# Patient Record
Sex: Male | Born: 1955 | Race: Asian | Hispanic: No | Smoking: Current some day smoker
Health system: Southern US, Community
[De-identification: ages and names within clinical notes are randomized; demographics above are authoritative.]

## PROBLEM LIST (undated history)

## (undated) DIAGNOSIS — K759 Inflammatory liver disease, unspecified: Secondary | ICD-10-CM

## (undated) DIAGNOSIS — I714 Abdominal aortic aneurysm, without rupture, unspecified: Secondary | ICD-10-CM

## (undated) DIAGNOSIS — B1910 Unspecified viral hepatitis B without hepatic coma: Secondary | ICD-10-CM

## (undated) DIAGNOSIS — K2960 Other gastritis without bleeding: Secondary | ICD-10-CM

## (undated) DIAGNOSIS — K219 Gastro-esophageal reflux disease without esophagitis: Secondary | ICD-10-CM

## (undated) DIAGNOSIS — D560 Alpha thalassemia: Secondary | ICD-10-CM

## (undated) DIAGNOSIS — I10 Essential (primary) hypertension: Secondary | ICD-10-CM

## (undated) DIAGNOSIS — K2270 Barrett's esophagus without dysplasia: Secondary | ICD-10-CM

## (undated) DIAGNOSIS — K319 Disease of stomach and duodenum, unspecified: Secondary | ICD-10-CM

## (undated) DIAGNOSIS — D563 Thalassemia minor: Secondary | ICD-10-CM

## (undated) HISTORY — PX: BACK SURGERY: SHX140

## (undated) HISTORY — DX: Abdominal aortic aneurysm, without rupture, unspecified: I71.40

## (undated) HISTORY — DX: Abdominal aortic aneurysm, without rupture: I71.4

---

## 2006-01-27 ENCOUNTER — Emergency Department: Payer: Self-pay | Admitting: General Practice

## 2006-02-02 ENCOUNTER — Ambulatory Visit: Payer: Self-pay | Admitting: Gastroenterology

## 2006-03-21 ENCOUNTER — Ambulatory Visit: Payer: Self-pay | Admitting: Gastroenterology

## 2007-09-03 ENCOUNTER — Inpatient Hospital Stay: Payer: Self-pay | Admitting: Gastroenterology

## 2007-09-03 ENCOUNTER — Ambulatory Visit: Payer: Self-pay | Admitting: Gastroenterology

## 2007-09-05 ENCOUNTER — Ambulatory Visit: Payer: Self-pay | Admitting: Internal Medicine

## 2007-10-02 ENCOUNTER — Ambulatory Visit: Payer: Self-pay | Admitting: Internal Medicine

## 2007-10-06 ENCOUNTER — Ambulatory Visit: Payer: Self-pay | Admitting: Internal Medicine

## 2007-11-06 ENCOUNTER — Ambulatory Visit: Payer: Self-pay | Admitting: Internal Medicine

## 2007-12-06 ENCOUNTER — Ambulatory Visit: Payer: Self-pay | Admitting: Internal Medicine

## 2008-01-06 ENCOUNTER — Ambulatory Visit: Payer: Self-pay | Admitting: Internal Medicine

## 2008-02-05 ENCOUNTER — Ambulatory Visit: Payer: Self-pay | Admitting: Internal Medicine

## 2008-03-07 ENCOUNTER — Ambulatory Visit: Payer: Self-pay | Admitting: Internal Medicine

## 2008-04-07 ENCOUNTER — Ambulatory Visit: Payer: Self-pay | Admitting: Internal Medicine

## 2008-06-05 ENCOUNTER — Ambulatory Visit: Payer: Self-pay | Admitting: Internal Medicine

## 2008-07-01 ENCOUNTER — Ambulatory Visit: Payer: Self-pay | Admitting: Internal Medicine

## 2008-07-05 ENCOUNTER — Ambulatory Visit: Payer: Self-pay | Admitting: Internal Medicine

## 2008-09-22 ENCOUNTER — Ambulatory Visit: Payer: Self-pay | Admitting: Gastroenterology

## 2008-09-30 ENCOUNTER — Ambulatory Visit: Payer: Self-pay | Admitting: Internal Medicine

## 2008-10-05 ENCOUNTER — Ambulatory Visit: Payer: Self-pay | Admitting: Internal Medicine

## 2008-11-05 ENCOUNTER — Ambulatory Visit: Payer: Self-pay | Admitting: Internal Medicine

## 2008-11-25 ENCOUNTER — Ambulatory Visit: Payer: Self-pay | Admitting: Internal Medicine

## 2008-12-05 ENCOUNTER — Ambulatory Visit: Payer: Self-pay | Admitting: Internal Medicine

## 2009-02-04 ENCOUNTER — Ambulatory Visit: Payer: Self-pay | Admitting: Internal Medicine

## 2009-02-17 ENCOUNTER — Ambulatory Visit: Payer: Self-pay | Admitting: Internal Medicine

## 2009-03-07 ENCOUNTER — Ambulatory Visit: Payer: Self-pay | Admitting: Internal Medicine

## 2009-08-18 ENCOUNTER — Ambulatory Visit: Payer: Self-pay | Admitting: Internal Medicine

## 2009-09-04 ENCOUNTER — Ambulatory Visit: Payer: Self-pay | Admitting: Internal Medicine

## 2009-11-05 ENCOUNTER — Ambulatory Visit: Payer: Self-pay | Admitting: Internal Medicine

## 2009-11-18 ENCOUNTER — Ambulatory Visit: Payer: Self-pay | Admitting: Internal Medicine

## 2009-12-05 ENCOUNTER — Ambulatory Visit: Payer: Self-pay | Admitting: Internal Medicine

## 2010-06-02 ENCOUNTER — Ambulatory Visit: Payer: Self-pay | Admitting: Internal Medicine

## 2010-06-06 ENCOUNTER — Ambulatory Visit: Payer: Self-pay | Admitting: Internal Medicine

## 2011-04-28 ENCOUNTER — Ambulatory Visit: Payer: Self-pay | Admitting: Oncology

## 2011-04-28 LAB — CBC CANCER CENTER
Basophil #: 0 x10 3/mm (ref 0.0–0.1)
Basophil %: 0.2 %
HCT: 36.2 % — ABNORMAL LOW (ref 40.0–52.0)
HGB: 11.6 g/dL — ABNORMAL LOW (ref 13.0–18.0)
Lymphocyte %: 23.6 %
Monocyte %: 7.1 %
Neutrophil #: 5 x10 3/mm (ref 1.4–6.5)
Neutrophil %: 67.5 %
WBC: 7.4 x10 3/mm (ref 3.8–10.6)

## 2011-04-28 LAB — IRON AND TIBC
Iron Saturation: 32 %
Unbound Iron-Bind.Cap.: 327 ug/dL

## 2011-04-28 LAB — FERRITIN: Ferritin (ARMC): 120 ng/mL (ref 8–388)

## 2011-05-06 ENCOUNTER — Ambulatory Visit: Payer: Self-pay | Admitting: Oncology

## 2011-05-24 LAB — CBC CANCER CENTER
Basophil #: 0 x10 3/mm (ref 0.0–0.1)
Eosinophil #: 0.2 x10 3/mm (ref 0.0–0.7)
Eosinophil %: 2.5 %
HGB: 12 g/dL — ABNORMAL LOW (ref 13.0–18.0)
Lymphocyte #: 1.4 x10 3/mm (ref 1.0–3.6)
Lymphocyte %: 23.1 %
Monocyte %: 7.3 %
Neutrophil #: 4 x10 3/mm (ref 1.4–6.5)
Neutrophil %: 66.5 %
Platelet: 105 x10 3/mm — ABNORMAL LOW (ref 150–440)
RBC: 6.93 10*6/uL — ABNORMAL HIGH (ref 4.40–5.90)
RDW: 17.8 % — ABNORMAL HIGH (ref 11.5–14.5)
WBC: 6.1 x10 3/mm (ref 3.8–10.6)

## 2011-05-26 LAB — CBC CANCER CENTER
Basophil #: 0.1 x10 3/mm (ref 0.0–0.1)
Eosinophil #: 0.1 x10 3/mm (ref 0.0–0.7)
HCT: 37.8 % — ABNORMAL LOW (ref 40.0–52.0)
HGB: 11.8 g/dL — ABNORMAL LOW (ref 13.0–18.0)
Lymphocyte #: 1.6 x10 3/mm (ref 1.0–3.6)
Lymphocyte %: 26.9 %
MCV: 55 fL — ABNORMAL LOW (ref 80–100)
Neutrophil #: 3.7 x10 3/mm (ref 1.4–6.5)
RBC: 6.82 10*6/uL — ABNORMAL HIGH (ref 4.40–5.90)
RDW: 16.9 % — ABNORMAL HIGH (ref 11.5–14.5)
WBC: 5.9 x10 3/mm (ref 3.8–10.6)

## 2011-06-06 ENCOUNTER — Ambulatory Visit: Payer: Self-pay | Admitting: Oncology

## 2011-06-06 ENCOUNTER — Ambulatory Visit: Payer: Self-pay | Admitting: Gastroenterology

## 2011-08-23 ENCOUNTER — Ambulatory Visit: Payer: Self-pay | Admitting: Oncology

## 2011-08-23 LAB — CBC CANCER CENTER
Basophil #: 0 x10 3/mm (ref 0.0–0.1)
Eosinophil #: 0.1 x10 3/mm (ref 0.0–0.7)
Eosinophil %: 2 %
HCT: 36.1 % — ABNORMAL LOW (ref 40.0–52.0)
Lymphocyte %: 20.3 %
MCHC: 30.5 g/dL — ABNORMAL LOW (ref 32.0–36.0)
Monocyte #: 0.6 x10 3/mm (ref 0.2–1.0)
Monocyte %: 9.9 %
Neutrophil #: 4 x10 3/mm (ref 1.4–6.5)
Neutrophil %: 67.1 %
Platelet: 93 x10 3/mm — ABNORMAL LOW (ref 150–440)
RBC: 6.45 10*6/uL — ABNORMAL HIGH (ref 4.40–5.90)
RDW: 17.8 % — ABNORMAL HIGH (ref 11.5–14.5)
WBC: 5.9 x10 3/mm (ref 3.8–10.6)

## 2011-08-23 LAB — IRON AND TIBC
Iron Bind.Cap.(Total): 380 ug/dL (ref 250–450)
Iron: 102 ug/dL (ref 65–175)

## 2011-09-05 ENCOUNTER — Ambulatory Visit: Payer: Self-pay | Admitting: Oncology

## 2012-01-11 ENCOUNTER — Other Ambulatory Visit: Payer: Self-pay | Admitting: Neurosurgery

## 2012-01-11 ENCOUNTER — Other Ambulatory Visit (HOSPITAL_COMMUNITY): Payer: Self-pay | Admitting: Neurosurgery

## 2012-01-11 DIAGNOSIS — M47816 Spondylosis without myelopathy or radiculopathy, lumbar region: Secondary | ICD-10-CM

## 2012-01-17 ENCOUNTER — Ambulatory Visit (HOSPITAL_COMMUNITY)
Admission: RE | Admit: 2012-01-17 | Discharge: 2012-01-17 | Disposition: A | Discharge status: Home / Self Care | Payer: BC Managed Care – PPO | Source: Ambulatory Visit | Attending: Neurosurgery | Admitting: Neurosurgery

## 2012-01-17 DIAGNOSIS — M47816 Spondylosis without myelopathy or radiculopathy, lumbar region: Secondary | ICD-10-CM

## 2012-01-17 DIAGNOSIS — M47817 Spondylosis without myelopathy or radiculopathy, lumbosacral region: Secondary | ICD-10-CM | POA: Insufficient documentation

## 2012-01-17 MED ORDER — OXYCODONE-ACETAMINOPHEN 5-325 MG PO TABS: 1.0000 | ORAL_TABLET | ORAL | Status: DC | PRN

## 2012-01-17 MED ORDER — ONDANSETRON HCL 4 MG/2ML IJ SOLN: 4.0000 mg | Freq: Four times a day (QID) | INTRAMUSCULAR | Status: DC | PRN

## 2012-01-17 MED ORDER — DIAZEPAM 5 MG PO TABS
10.0000 mg | ORAL_TABLET | Freq: Once | ORAL | Status: AC
  Administered 2012-01-17: 10 mg via ORAL

## 2012-01-17 MED ORDER — IOHEXOL 180 MG/ML  SOLN
20.0000 mL | Freq: Once | INTRAMUSCULAR | Status: AC | PRN
  Administered 2012-01-17: 20 mL via INTRATHECAL

## 2012-01-17 MED ORDER — DIAZEPAM 5 MG PO TABS
ORAL_TABLET | ORAL | Status: AC
  Administered 2012-01-17: 10 mg via ORAL
  Filled 2012-01-17: qty 2

## 2012-01-17 NOTE — Procedures (Addendum)
L1/2 puncture, Omnipaque 180

## 2012-01-23 ENCOUNTER — Ambulatory Visit: Payer: Self-pay | Admitting: Gastroenterology

## 2012-01-24 LAB — PATHOLOGY REPORT

## 2012-02-21 ENCOUNTER — Ambulatory Visit: Payer: Self-pay | Admitting: Oncology

## 2012-02-21 LAB — CBC CANCER CENTER
Basophil %: 0.9 %
Eosinophil %: 2.2 %
HGB: 12.2 g/dL — ABNORMAL LOW (ref 13.0–18.0)
Lymphocyte %: 27 %
Monocyte %: 8.8 %
Neutrophil %: 61.1 %
RBC: 7.08 10*6/uL — ABNORMAL HIGH (ref 4.40–5.90)

## 2012-03-07 ENCOUNTER — Ambulatory Visit: Payer: Self-pay | Admitting: Oncology

## 2012-08-17 ENCOUNTER — Ambulatory Visit: Payer: Self-pay | Admitting: Oncology

## 2012-08-21 LAB — CBC CANCER CENTER
Basophil #: 0.1 x10 3/mm (ref 0.0–0.1)
Basophil %: 0.9 %
Eosinophil #: 0.2 x10 3/mm (ref 0.0–0.7)
Eosinophil %: 3.7 %
Lymphocyte %: 24.6 %
MCH: 17.5 pg — ABNORMAL LOW (ref 26.0–34.0)
MCHC: 31.6 g/dL — ABNORMAL LOW (ref 32.0–36.0)
Monocyte #: 0.6 x10 3/mm (ref 0.2–1.0)
Monocyte %: 10.1 %
Neutrophil #: 3.7 x10 3/mm (ref 1.4–6.5)
Neutrophil %: 60.7 %
WBC: 6.1 x10 3/mm (ref 3.8–10.6)

## 2012-09-04 ENCOUNTER — Ambulatory Visit: Payer: Self-pay | Admitting: Oncology

## 2014-03-21 ENCOUNTER — Ambulatory Visit: Payer: Self-pay | Admitting: Gastroenterology

## 2014-03-25 ENCOUNTER — Ambulatory Visit: Payer: Self-pay | Admitting: Gastroenterology

## 2014-03-25 LAB — HEPATIC FUNCTION PANEL A (ARMC)
ALK PHOS: 43 U/L — AB
AST: 27 U/L (ref 15–37)
Albumin: 3.4 g/dL (ref 3.4–5.0)
BILIRUBIN DIRECT: 0.1 mg/dL (ref 0.0–0.2)
Bilirubin,Total: 0.7 mg/dL (ref 0.2–1.0)
SGPT (ALT): 43 U/L
Total Protein: 6.8 g/dL (ref 6.4–8.2)

## 2014-03-25 LAB — CBC WITH DIFFERENTIAL/PLATELET
BASOS PCT: 0.7 %
Basophil #: 0 10*3/uL (ref 0.0–0.1)
Eosinophil #: 0.1 10*3/uL (ref 0.0–0.7)
Eosinophil %: 2.5 %
HCT: 37.5 % — ABNORMAL LOW (ref 40.0–52.0)
HGB: 11.5 g/dL — ABNORMAL LOW (ref 13.0–18.0)
Lymphocyte #: 1.3 10*3/uL (ref 1.0–3.6)
Lymphocyte %: 28.7 %
MCH: 17.5 pg — AB (ref 26.0–34.0)
MCHC: 30.7 g/dL — ABNORMAL LOW (ref 32.0–36.0)
MCV: 57 fL — ABNORMAL LOW (ref 80–100)
MONO ABS: 0.4 x10 3/mm (ref 0.2–1.0)
Monocyte %: 9.7 %
NEUTROS PCT: 58.4 %
Neutrophil #: 2.6 10*3/uL (ref 1.4–6.5)
PLATELETS: 107 10*3/uL — AB (ref 150–440)
RBC: 6.59 10*6/uL — ABNORMAL HIGH (ref 4.40–5.90)
RDW: 16.5 % — AB (ref 11.5–14.5)
WBC: 4.4 10*3/uL (ref 3.8–10.6)

## 2014-03-25 LAB — PROTIME-INR
INR: 1
Prothrombin Time: 13.3 secs (ref 11.5–14.7)

## 2014-06-30 LAB — SURGICAL PATHOLOGY

## 2015-03-23 ENCOUNTER — Other Ambulatory Visit: Payer: Self-pay | Admitting: Gastroenterology

## 2015-03-23 DIAGNOSIS — B181 Chronic viral hepatitis B without delta-agent: Secondary | ICD-10-CM

## 2015-03-26 ENCOUNTER — Ambulatory Visit
Admission: RE | Admit: 2015-03-26 | Discharge: 2015-03-26 | Disposition: A | Discharge status: Home / Self Care | Payer: BLUE CROSS/BLUE SHIELD | Source: Ambulatory Visit | Attending: Gastroenterology | Admitting: Gastroenterology

## 2015-03-26 DIAGNOSIS — B181 Chronic viral hepatitis B without delta-agent: Secondary | ICD-10-CM | POA: Diagnosis not present

## 2015-03-26 DIAGNOSIS — I714 Abdominal aortic aneurysm, without rupture: Secondary | ICD-10-CM | POA: Insufficient documentation

## 2015-07-08 ENCOUNTER — Emergency Department
Admission: EM | Admit: 2015-07-08 | Discharge: 2015-07-08 | Disposition: A | Discharge status: Home / Self Care | Payer: BLUE CROSS/BLUE SHIELD

## 2015-07-10 ENCOUNTER — Emergency Department
Admission: EM | Admit: 2015-07-10 | Discharge: 2015-07-10 | Disposition: A | Discharge status: Home / Self Care | Payer: No Typology Code available for payment source | Attending: Student | Admitting: Student

## 2015-07-10 ENCOUNTER — Emergency Department: Payer: No Typology Code available for payment source

## 2015-07-10 DIAGNOSIS — R52 Pain, unspecified: Secondary | ICD-10-CM

## 2015-07-10 DIAGNOSIS — S7012XA Contusion of left thigh, initial encounter: Secondary | ICD-10-CM | POA: Insufficient documentation

## 2015-07-10 DIAGNOSIS — Y999 Unspecified external cause status: Secondary | ICD-10-CM | POA: Insufficient documentation

## 2015-07-10 DIAGNOSIS — Z87891 Personal history of nicotine dependence: Secondary | ICD-10-CM | POA: Diagnosis not present

## 2015-07-10 DIAGNOSIS — S7002XA Contusion of left hip, initial encounter: Secondary | ICD-10-CM | POA: Insufficient documentation

## 2015-07-10 DIAGNOSIS — I10 Essential (primary) hypertension: Secondary | ICD-10-CM | POA: Diagnosis not present

## 2015-07-10 DIAGNOSIS — Z79899 Other long term (current) drug therapy: Secondary | ICD-10-CM | POA: Diagnosis not present

## 2015-07-10 DIAGNOSIS — Z7982 Long term (current) use of aspirin: Secondary | ICD-10-CM | POA: Diagnosis not present

## 2015-07-10 DIAGNOSIS — S39012A Strain of muscle, fascia and tendon of lower back, initial encounter: Secondary | ICD-10-CM | POA: Diagnosis not present

## 2015-07-10 DIAGNOSIS — S3992XA Unspecified injury of lower back, initial encounter: Secondary | ICD-10-CM | POA: Diagnosis present

## 2015-07-10 DIAGNOSIS — Y939 Activity, unspecified: Secondary | ICD-10-CM | POA: Insufficient documentation

## 2015-07-10 DIAGNOSIS — Y929 Unspecified place or not applicable: Secondary | ICD-10-CM | POA: Insufficient documentation

## 2015-07-10 HISTORY — DX: Essential (primary) hypertension: I10

## 2015-07-10 MED ORDER — METHOCARBAMOL 750 MG PO TABS: 750.0000 mg | ORAL_TABLET | Freq: Four times a day (QID) | ORAL | Status: DC

## 2015-07-10 MED ORDER — TRAMADOL HCL 50 MG PO TABS: 50.0000 mg | ORAL_TABLET | Freq: Four times a day (QID) | ORAL | Status: DC | PRN

## 2015-07-10 MED ORDER — IBUPROFEN 800 MG PO TABS: 800.0000 mg | ORAL_TABLET | Freq: Three times a day (TID) | ORAL | Status: DC | PRN
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
## 2015-11-10 ENCOUNTER — Other Ambulatory Visit: Payer: Self-pay | Admitting: Gastroenterology

## 2015-11-10 DIAGNOSIS — B181 Chronic viral hepatitis B without delta-agent: Secondary | ICD-10-CM

## 2015-11-17 ENCOUNTER — Ambulatory Visit
Admission: RE | Admit: 2015-11-17 | Discharge: 2015-11-17 | Disposition: A | Discharge status: Home / Self Care | Payer: BLUE CROSS/BLUE SHIELD | Source: Ambulatory Visit | Attending: Gastroenterology | Admitting: Gastroenterology

## 2015-11-17 DIAGNOSIS — I77811 Abdominal aortic ectasia: Secondary | ICD-10-CM | POA: Diagnosis not present

## 2015-11-17 DIAGNOSIS — B181 Chronic viral hepatitis B without delta-agent: Secondary | ICD-10-CM | POA: Diagnosis not present

## 2015-11-17 DIAGNOSIS — R161 Splenomegaly, not elsewhere classified: Secondary | ICD-10-CM | POA: Insufficient documentation

## 2015-12-21 ENCOUNTER — Other Ambulatory Visit: Payer: Self-pay | Admitting: Gastroenterology

## 2015-12-21 DIAGNOSIS — B181 Chronic viral hepatitis B without delta-agent: Secondary | ICD-10-CM

## 2015-12-23 ENCOUNTER — Ambulatory Visit
Admission: RE | Admit: 2015-12-23 | Discharge: 2015-12-23 | Disposition: A | Discharge status: Home / Self Care | Payer: BLUE CROSS/BLUE SHIELD | Source: Ambulatory Visit | Attending: Gastroenterology | Admitting: Gastroenterology

## 2015-12-23 ENCOUNTER — Other Ambulatory Visit: Payer: Self-pay | Admitting: Gastroenterology

## 2015-12-23 DIAGNOSIS — B181 Chronic viral hepatitis B without delta-agent: Secondary | ICD-10-CM | POA: Diagnosis present

## 2016-05-10 ENCOUNTER — Other Ambulatory Visit: Payer: Self-pay | Admitting: Gastroenterology

## 2016-05-10 DIAGNOSIS — B181 Chronic viral hepatitis B without delta-agent: Secondary | ICD-10-CM

## 2016-05-13 ENCOUNTER — Ambulatory Visit
Admission: RE | Admit: 2016-05-13 | Discharge: 2016-05-13 | Disposition: A | Discharge status: Home / Self Care | Payer: BLUE CROSS/BLUE SHIELD | Source: Ambulatory Visit | Attending: Gastroenterology | Admitting: Gastroenterology

## 2016-05-13 DIAGNOSIS — I77811 Abdominal aortic ectasia: Secondary | ICD-10-CM | POA: Diagnosis not present

## 2016-05-13 DIAGNOSIS — B181 Chronic viral hepatitis B without delta-agent: Secondary | ICD-10-CM | POA: Insufficient documentation

## 2016-11-15 ENCOUNTER — Other Ambulatory Visit: Payer: Self-pay | Admitting: Gastroenterology

## 2016-11-15 DIAGNOSIS — B181 Chronic viral hepatitis B without delta-agent: Secondary | ICD-10-CM

## 2016-11-16 DIAGNOSIS — B181 Chronic viral hepatitis B without delta-agent: Secondary | ICD-10-CM | POA: Insufficient documentation

## 2016-11-21 ENCOUNTER — Ambulatory Visit
Admission: RE | Admit: 2016-11-21 | Discharge: 2016-11-21 | Disposition: A | Discharge status: Home / Self Care | Payer: Medicare Other | Source: Ambulatory Visit | Attending: Gastroenterology | Admitting: Gastroenterology

## 2016-11-21 DIAGNOSIS — K7460 Unspecified cirrhosis of liver: Secondary | ICD-10-CM | POA: Insufficient documentation

## 2016-11-21 DIAGNOSIS — I77811 Abdominal aortic ectasia: Secondary | ICD-10-CM | POA: Diagnosis not present

## 2016-11-21 DIAGNOSIS — B181 Chronic viral hepatitis B without delta-agent: Secondary | ICD-10-CM | POA: Diagnosis present

## 2017-01-30 ENCOUNTER — Encounter: Payer: Self-pay | Admitting: *Deleted

## 2017-01-31 ENCOUNTER — Encounter: Payer: Self-pay | Admitting: *Deleted

## 2017-01-31 ENCOUNTER — Encounter
Admission: RE | Disposition: A | Discharge status: Home / Self Care | Payer: Self-pay | Source: Ambulatory Visit | Attending: Internal Medicine

## 2017-01-31 ENCOUNTER — Ambulatory Visit: Payer: Medicare Other | Admitting: Certified Registered Nurse Anesthetist

## 2017-01-31 ENCOUNTER — Ambulatory Visit
Admission: RE | Admit: 2017-01-31 | Discharge: 2017-01-31 | Disposition: A | Discharge status: Home / Self Care | Payer: Medicare Other | Source: Ambulatory Visit | Attending: Internal Medicine | Admitting: Internal Medicine

## 2017-01-31 DIAGNOSIS — I10 Essential (primary) hypertension: Secondary | ICD-10-CM | POA: Diagnosis not present

## 2017-01-31 DIAGNOSIS — D560 Alpha thalassemia: Secondary | ICD-10-CM | POA: Diagnosis not present

## 2017-01-31 DIAGNOSIS — J449 Chronic obstructive pulmonary disease, unspecified: Secondary | ICD-10-CM | POA: Diagnosis not present

## 2017-01-31 DIAGNOSIS — K2270 Barrett's esophagus without dysplasia: Secondary | ICD-10-CM | POA: Diagnosis not present

## 2017-01-31 DIAGNOSIS — K219 Gastro-esophageal reflux disease without esophagitis: Secondary | ICD-10-CM | POA: Insufficient documentation

## 2017-01-31 DIAGNOSIS — K3189 Other diseases of stomach and duodenum: Secondary | ICD-10-CM | POA: Diagnosis not present

## 2017-01-31 DIAGNOSIS — Z79899 Other long term (current) drug therapy: Secondary | ICD-10-CM | POA: Insufficient documentation

## 2017-01-31 DIAGNOSIS — K5730 Diverticulosis of large intestine without perforation or abscess without bleeding: Secondary | ICD-10-CM | POA: Diagnosis not present

## 2017-01-31 DIAGNOSIS — F17200 Nicotine dependence, unspecified, uncomplicated: Secondary | ICD-10-CM | POA: Insufficient documentation

## 2017-01-31 DIAGNOSIS — Z1211 Encounter for screening for malignant neoplasm of colon: Secondary | ICD-10-CM | POA: Insufficient documentation

## 2017-01-31 DIAGNOSIS — Z7982 Long term (current) use of aspirin: Secondary | ICD-10-CM | POA: Diagnosis not present

## 2017-01-31 DIAGNOSIS — K635 Polyp of colon: Secondary | ICD-10-CM | POA: Insufficient documentation

## 2017-01-31 HISTORY — DX: Alpha thalassemia: D56.0

## 2017-01-31 HISTORY — DX: Gastro-esophageal reflux disease without esophagitis: K21.9

## 2017-01-31 HISTORY — DX: Inflammatory liver disease, unspecified: K75.9

## 2017-01-31 HISTORY — DX: Disease of stomach and duodenum, unspecified: K31.9

## 2017-01-31 HISTORY — DX: Barrett's esophagus without dysplasia: K22.70

## 2017-01-31 HISTORY — PX: COLONOSCOPY WITH PROPOFOL: SHX5780

## 2017-01-31 HISTORY — DX: Thalassemia minor: D56.3

## 2017-01-31 HISTORY — DX: Other gastritis without bleeding: K29.60

## 2017-01-31 HISTORY — PX: ESOPHAGOGASTRODUODENOSCOPY (EGD) WITH PROPOFOL: SHX5813

## 2017-01-31 LAB — CBC WITH DIFFERENTIAL/PLATELET
Basophils Absolute: 0 10*3/uL (ref 0–0.1)
Basophils Relative: 1 %
Eosinophils Absolute: 0.5 10*3/uL (ref 0–0.7)
Eosinophils Relative: 7 %
HEMATOCRIT: 43 % (ref 40.0–52.0)
Hemoglobin: 13.3 g/dL (ref 13.0–18.0)
LYMPHS ABS: 1.8 10*3/uL (ref 1.0–3.6)
LYMPHS PCT: 26 %
MCH: 17 pg — AB (ref 26.0–34.0)
MCHC: 30.9 g/dL — AB (ref 32.0–36.0)
MCV: 55.1 fL — AB (ref 80.0–100.0)
MONO ABS: 0.4 10*3/uL (ref 0.2–1.0)
MONOS PCT: 6 %
NEUTROS ABS: 4.3 10*3/uL (ref 1.4–6.5)
Neutrophils Relative %: 60 %
Platelets: 141 10*3/uL — ABNORMAL LOW (ref 150–440)
RBC: 7.8 MIL/uL — ABNORMAL HIGH (ref 4.40–5.90)
RDW: 16.2 % — AB (ref 11.5–14.5)
WBC: 7.1 10*3/uL (ref 3.8–10.6)

## 2017-01-31 LAB — PROTIME-INR
INR: 0.96
Prothrombin Time: 12.7 seconds (ref 11.4–15.2)

## 2017-01-31 SURGERY — ESOPHAGOGASTRODUODENOSCOPY (EGD) WITH PROPOFOL
Anesthesia: General

## 2017-01-31 MED ORDER — PROPOFOL 500 MG/50ML IV EMUL
INTRAVENOUS | Status: AC
  Filled 2017-01-31: qty 50

## 2017-01-31 MED ORDER — PROPOFOL 10 MG/ML IV BOLUS
INTRAVENOUS | Status: DC | PRN
  Administered 2017-01-31: 240 mg via INTRAVENOUS

## 2017-01-31 MED ORDER — SODIUM CHLORIDE 0.9 % IV SOLN
INTRAVENOUS | Status: DC
  Administered 2017-01-31: 13:00:00 via INTRAVENOUS

## 2017-01-31 MED ORDER — LIDOCAINE HCL (CARDIAC) 20 MG/ML IV SOLN
INTRAVENOUS | Status: DC | PRN
  Administered 2017-01-31: 50 mg via INTRAVENOUS

## 2017-01-31 NOTE — Anesthesia Preprocedure Evaluation (Signed)
Anesthesia Evaluation  Patient identified by MRN, date of birth, ID band Patient awake    Reviewed: Allergy & Precautions, NPO status , Patient's Chart, lab work & pertinent test results  Airway Mallampati: III       Dental  (+) Teeth Intact   Pulmonary COPD, Current Smoker,     + decreased breath sounds      Cardiovascular Exercise Tolerance: Good hypertension, Pt. on medications  Rhythm:Regular Rate:Normal     Neuro/Psych negative neurological ROS  negative psych ROS   GI/Hepatic PUD, GERD  Medicated,(+) Hepatitis -, Unspecified  Endo/Other  negative endocrine ROS  Renal/GU negative Renal ROS  negative genitourinary   Musculoskeletal   Abdominal Normal abdominal exam  (+)   Peds negative pediatric ROS (+)  Hematology  (+) anemia ,   Anesthesia Other Findings   Reproductive/Obstetrics                             Anesthesia Physical Anesthesia Plan  ASA: II  Anesthesia Plan: General   Post-op Pain Management:    Induction:   PONV Risk Score and Plan: 0  Airway Management Planned: Natural Airway and Nasal Cannula  Additional Equipment:   Intra-op Plan:   Post-operative Plan:   Informed Consent: I have reviewed the patients History and Physical, chart, labs and discussed the procedure including the risks, benefits and alternatives for the proposed anesthesia with the patient or authorized representative who has indicated his/her understanding and acceptance.     Plan Discussed with: CRNA  Anesthesia Plan Comments:         Anesthesia Quick Evaluation

## 2017-01-31 NOTE — H&P (Signed)
Outpatient short stay form Pre-procedure 01/31/2017 1:54 PM Bee Marchiano K. Norma Fredricksonoledo, M.D.  Primary Physician: Dewaine Oatsenny Tate, M.D.  Reason for visit: Barrett's esophagus surveillance, Colon cancer screening.  History of present illness patient is a pleasant 61 year old Asian male presenting for Barrett's esophagus surveillance without history of dysplasia as well as for general colon cancer screening. Patient denies a change in bowel habits, dysphagia rectal bleeding or abdominal pain. oW abdominal pain.  without history of dysplasia as well as for general colon cancer screening.  Patient denies any change in bowel habits, dysphagia rectal bleeding patient is a pleasant 61 year old Asian male presenting for Barrett's esophagus surveillance    Current Facility-Administered Medications:  .  0.9 %  sodium chloride infusion, , Intravenous, Continuous, [place]oledo, Boykin Nearingeodoro K, MD, Last Rate: 20 mL/hr at 01/31/17 1304  Medications Prior to Admission  Medication Sig Dispense Refill Last Dose  . aspirin EC 81 MG tablet Take 81 mg by mouth daily.   Past Month at Unknown time  . carvedilol (COREG) 25 MG tablet Take 25 mg by mouth 2 (two) times daily with a meal.   01/30/2017 at Unknown time  . folic acid (FOLVITE) 1 MG tablet Take 1 mg by mouth daily.   01/30/2017 at Unknown time  . hydrochlorothiazide (HYDRODIURIL) 25 MG tablet Take 25 mg by mouth daily.   01/30/2017 at Unknown time  . ibuprofen (ADVIL,MOTRIN) 800 MG tablet Take 1 tablet (800 mg total) by mouth every 8 (eight) hours as needed. 30 tablet 0 Past Month at Unknown time  . lisinopril (PRINIVIL,ZESTRIL) 30 MG tablet Take 30 mg by mouth 2 (two) times daily.    01/30/2017 at Unknown time  . omeprazole (PRILOSEC) 20 MG capsule Take 20 mg by mouth daily.   01/30/2017 at Unknown time  . polyethylene glycol-electrolytes (NULYTELY/GOLYTELY) 420 g solution Take 4,000 mLs by mouth once.     Marland Kitchen. tenofovir (VIREAD) 300 MG tablet Take 300 mg by mouth daily.     .  methocarbamol (ROBAXIN-750) 750 MG tablet Take 1 tablet (750 mg total) by mouth 4 (four) times daily. (Patient not taking: Reported on 01/31/2017) 20 tablet 0 Not Taking at Unknown time  . traMADol (ULTRAM) 50 MG tablet Take 1 tablet (50 mg total) by mouth every 6 (six) hours as needed for moderate pain. (Patient not taking: Reported on 01/31/2017) 12 tablet 0 Not Taking at Unknown time     No Known Allergies   Past Medical History:  Diagnosis Date  . Alpha+ thalassemia   . Barrett's esophagus   . Erosive gastritis   . Gastropathy   . GERD (gastroesophageal reflux disease)   . Hepatitis    chronic hep.B of low infectivity  . Hypertension     Review of systems:      Physical Exam  General appearance: alert, cooperative and appears stated age Resp: clear to auscultation bilaterally Cardio: regular rate and rhythm, S1, S2 normal, no murmur, click, rub or gallop GI: soft, non-tender; bowel sounds normal; no masses,  no organomegaly Extremities: extremities normal, atraumatic, no cyanosis or edema EGD and colonoscopy..     Planned procedures: EGD and colonoscopy. The patient understands the nature of the planned procedure, indications, risks, alternatives and potential complications including but not limited to bleeding, infection, perforation, damage to internal organs and possible oversedation/side effects from anesthesia. The patient agrees and gives consent to proceed.  Please refer to procedure notes for findings, recommendations and patient disposition/instructions.    Steven Veazie K. Norma Fredricksonoledo, M.D. Gastroenterology 01/31/2017  1:54 PM

## 2017-01-31 NOTE — Anesthesia Post-op Follow-up Note (Signed)
Anesthesia QCDR form completed.        

## 2017-01-31 NOTE — Op Note (Signed)
[facility] Gastroenterology Patient Name: Lawson FiscalBandy Syme Procedure Date: 01/31/2017 1:37 PM MRN: [id number] Account #: [id number] Date of Birth: 05/25/1955 Admit Type: Outpatient Age: [age] Room: [facility] ENDO ROOM 4 Gender: Male Note Status: Finalized Procedure:            Colonoscopy Indications:          Screening for colorectal malignant neoplasm Providers:            Boykin Nearingeodoro K. Norma Fredricksonoledo MD, MD Referring MD:         Jillene Bucksenny C. Arlana Pouchate, MD (Referring MD) Medicines:            Propofol per Anesthesia Complications:        No immediate complications. Procedure:            Pre-Anesthesia Assessment:                       - The risks and benefits of the procedure and the                        sedation options and risks were discussed with the                        patient. All questions were answered and informed                        consent was obtained.                       - Patient identification and proposed procedure were                        verified prior to the procedure by the nurse. The                        procedure was verified in the procedure room.                       - ASA Grade Assessment: II - A patient with mild                        systemic disease.                       - After reviewing the risks and benefits, the patient                        was deemed in satisfactory condition to undergo the                        procedure.                       After obtaining informed consent, the colonoscope was                        passed under direct vision. Throughout the procedure,                        the patient's blood pressure, pulse, and oxygen  saturations were monitored continuously. The                        Colonoscope was introduced through the anus and                        advanced to the the cecum, identified by appendiceal                        orifice and ileocecal valve. The colonoscopy was           performed without difficulty. The patient tolerated the                        procedure well. The quality of the bowel preparation                        was good. The ileocecal valve, appendiceal orifice, and                        rectum were photographed. Findings:      The perianal and digital rectal examinations were normal. Pertinent       negatives include normal sphincter tone and no palpable rectal lesions.      Multiple small and large-mouthed diverticula were found in the entire       colon. There was no evidence of diverticular bleeding.      A 3 mm polyp was found in the sigmoid colon. The polyp was sessile. The       polyp was removed with a jumbo cold forceps. Resection and retrieval       were complete.      The exam was otherwise without abnormality on direct and retroflexion       views. Impression:           - Moderate diverticulosis in the entire examined colon.                        There was no evidence of diverticular bleeding.                       - One 3 mm polyp in the sigmoid colon, removed with a                        jumbo cold forceps. Resected and retrieved.                       - The examination was otherwise normal on direct and                        retroflexion views. Recommendation:       - Patient has a contact number available for                        emergencies. The signs and symptoms of potential                        delayed complications were discussed with the patient.                        Return to normal activities tomorrow. Written  discharge                        instructions were provided to the patient.                       - Resume previous diet.                       - Continue present medications.                       - Await pathology results.                       - Repeat colonoscopy is recommended for surveillance.                        The colonoscopy date will be determined after pathology                         results from today's exam become available for review.                       - Return to GI office in 1 year.                       - The findings and recommendations were discussed with                        the patient and their family. Procedure Code(s):    --- Professional ---                       [id number], Colonoscopy, flexible; with biopsy, single or                        multiple Diagnosis Code(s):    --- Professional ---                       Z12.11, Encounter for screening for malignant neoplasm                        of colon                       D12.5, Benign neoplasm of sigmoid colon                       K57.30, Diverticulosis of large intestine without                        perforation or abscess without bleeding CPT copyright 2016 American Medical Association. All rights reserved. The codes documented in this report are preliminary and upon coder review may  be revised to meet current compliance requirements. Stanton Kidneyeodoro K Ryland Smoots MD, MD 01/31/2017 2:25:00 PM This report has been signed electronically. Number of Addenda: 0 Note Initiated On: 01/31/2017 1:37 PM Scope Withdrawal Time: 0 hours 6 minutes 39 seconds  Total Procedure Duration: 0 hours 9 minutes 33 seconds       [facility]

## 2017-01-31 NOTE — Interval H&P Note (Signed)
History and Physical Interval Note:  01/31/2017 1:57 PM  Arthur Schroeder  has presented today for surgery, with the diagnosis of SCREENING BARRETTS  The various methods of treatment have been discussed with the patient and family. After consideration of risks, benefits and other options for treatment, the patient has consented to  Procedure(s): ESOPHAGOGASTRODUODENOSCOPY (EGD) WITH PROPOFOL (N/A) COLONOSCOPY WITH PROPOFOL (N/A) as a surgical intervention .  The patient's history has been reviewed, patient examined, no change in status, stable for surgery.  I have reviewed the patient's chart and labs.  Questions were answered to the patient's satisfaction.     [name], [place]

## 2017-01-31 NOTE — Transfer of Care (Signed)
Immediate Anesthesia Transfer of Care Note  Patient: Arthur Schroeder  Procedure(s) Performed: ESOPHAGOGASTRODUODENOSCOPY (EGD) WITH PROPOFOL (N/A ) COLONOSCOPY WITH PROPOFOL (N/A )  Patient Location: PACU  Anesthesia Type:MAC  Level of Consciousness: drowsy  Airway & Oxygen Therapy: Patient Spontanous Breathing and Patient connected to nasal cannula oxygen  Post-op Assessment: Report given to RN and Post -op Vital signs reviewed and stable  Post vital signs: Reviewed and stable  Last Vitals:  Vitals:   01/31/17 1243 01/31/17 1424  BP: 132/78 (P) 96/70  Pulse: 70   Resp: 16   Temp: 36.7 C (P) 36.7 C  SpO2: 98%     Last Pain:  Vitals:   01/31/17 1424  TempSrc: (P) Tympanic         Complications: No apparent anesthesia complications

## 2017-01-31 NOTE — Op Note (Signed)
[facility] Gastroenterology Patient Name: Arthur Schroeder Procedure Date: 01/31/2017 1:37 PM MRN: [id number] Account #: [id number] Date of Birth: 1955-10-14 Admit Type: Outpatient Age: 61 Room: [facility] ENDO ROOM 4 Gender: Male Note Status: Finalized Procedure:            Upper GI endoscopy Indications:          Surveillance for malignancy due to personal history of                        Barrett's esophagus Providers:            Boykin Nearing. Norma Fredrickson MD, MD Referring MD:         Jillene Bucks. Arlana Pouch, MD (Referring MD) Medicines:            Propofol per Anesthesia Complications:        No immediate complications. Procedure:            Pre-Anesthesia Assessment:                       - The risks and benefits of the procedure and the                        sedation options and risks were discussed with the                        patient. All questions were answered and informed                        consent was obtained.                       - Patient identification and proposed procedure were                        verified prior to the procedure by the physician and                        the nurse. The procedure was verified in the procedure                        room.                       - ASA Grade Assessment: II - A patient with mild                        systemic disease.                       - After reviewing the risks and benefits, the patient                        was deemed in satisfactory condition to undergo the                        procedure.                       After obtaining informed consent, the endoscope was  passed under direct vision. Throughout the procedure,                        the patient's blood pressure, pulse, and oxygen                        saturations were monitored continuously. The Endoscope                        was introduced through the mouth, and advanced to the                        third part of duodenum.  The upper GI endoscopy was                        accomplished without difficulty. The patient tolerated                        the procedure well. Findings:      The esophagus and gastroesophageal junction were examined with white       light. There were esophageal mucosal changes consistent with Barrett's       esophagus. These changes involved the mucosa along an irregular Z-line       (37 cm from the incisors). Salmon-colored mucosa was present. The       maximum longitudinal extent of these esophageal mucosal changes was 0.5       cm in length. Mucosa was biopsied with a cold forceps for histology in 4       quadrants at intervals of 0.5 cm at the gastroesophageal junction. One       specimen bottle was sent to pathology.      The entire examined stomach was normal. Biopsies were taken with a cold       forceps for histology. Biopsied done in cardia      The examined duodenum was normal. Impression:           - Esophageal mucosal changes consistent with Barrett's                        esophagus. Biopsied.                       - Normal stomach. Biopsied.                       - Normal examined duodenum. Recommendation:       - Await pathology results.                       - See the other procedure note for documentation of                        additional recommendations. Procedure Code(s):    --- Professional ---                       [phone], Esophagogastroduodenoscopy, flexible, transoral;                        with biopsy, single or multiple Diagnosis Code(s):    --- Professional ---  K22.70, Barrett's esophagus without dysplasia CPT copyright 2016 American Medical Association. All rights reserved. The codes documented in this report are preliminary and upon coder review may  be revised to meet current compliance requirements. Stanton Kidneyeodoro K Mauriana Dann MD, MD 01/31/2017 2:09:25 PM This report has been signed electronically. Number of Addenda: 0 Note Initiated  On: 01/31/2017 1:37 PM      [facility]

## 2017-02-01 ENCOUNTER — Encounter: Payer: Self-pay | Admitting: Internal Medicine

## 2017-02-01 NOTE — Anesthesia Postprocedure Evaluation (Signed)
Anesthesia Post Note  Patient: Lawson FiscalBandy Tomasetti  Procedure(s) Performed: ESOPHAGOGASTRODUODENOSCOPY (EGD) WITH PROPOFOL (N/A ) COLONOSCOPY WITH PROPOFOL (N/A )  Patient location during evaluation: PACU Anesthesia Type: General Level of consciousness: awake Pain management: pain level controlled Vital Signs Assessment: post-procedure vital signs reviewed and stable Respiratory status: nonlabored ventilation Cardiovascular status: stable Anesthetic complications: no     Last Vitals:  Vitals:   01/31/17 1444 01/31/17 1454  BP: 125/87 125/87  Pulse: 67 65  Resp: (!) 24 (!) 22  Temp:    SpO2: 97% 97%    Last Pain:  Vitals:   01/31/17 1424  TempSrc: Tympanic                 VAN STAVEREN,Rosa [place]ale

## 2017-02-02 LAB — SURGICAL PATHOLOGY

## 2017-10-12 ENCOUNTER — Other Ambulatory Visit: Payer: Self-pay | Admitting: Gastroenterology

## 2017-10-12 DIAGNOSIS — B181 Chronic viral hepatitis B without delta-agent: Secondary | ICD-10-CM

## 2017-10-17 ENCOUNTER — Ambulatory Visit
Admission: RE | Admit: 2017-10-17 | Discharge: 2017-10-17 | Disposition: A | Discharge status: Home / Self Care | Payer: Medicare Other | Source: Ambulatory Visit | Attending: Gastroenterology | Admitting: Gastroenterology

## 2017-10-17 DIAGNOSIS — R932 Abnormal findings on diagnostic imaging of liver and biliary tract: Secondary | ICD-10-CM | POA: Diagnosis not present

## 2017-10-17 DIAGNOSIS — I714 Abdominal aortic aneurysm, without rupture: Secondary | ICD-10-CM | POA: Diagnosis not present

## 2017-10-17 DIAGNOSIS — B181 Chronic viral hepatitis B without delta-agent: Secondary | ICD-10-CM | POA: Insufficient documentation

## 2017-10-31 ENCOUNTER — Encounter (INDEPENDENT_AMBULATORY_CARE_PROVIDER_SITE_OTHER): Payer: Self-pay | Admitting: Vascular Surgery

## 2017-10-31 ENCOUNTER — Ambulatory Visit (INDEPENDENT_AMBULATORY_CARE_PROVIDER_SITE_OTHER): Payer: Medicare Other | Admitting: Vascular Surgery

## 2017-10-31 VITALS — BP 131/80 | HR 62 | Resp 12 | Ht 68.0 in | Wt 176.0 lb

## 2017-10-31 DIAGNOSIS — I714 Abdominal aortic aneurysm, without rupture, unspecified: Secondary | ICD-10-CM

## 2017-10-31 DIAGNOSIS — K2100 Gastro-esophageal reflux disease with esophagitis, without bleeding: Secondary | ICD-10-CM

## 2017-10-31 DIAGNOSIS — K219 Gastro-esophageal reflux disease without esophagitis: Secondary | ICD-10-CM | POA: Insufficient documentation

## 2017-10-31 DIAGNOSIS — I10 Essential (primary) hypertension: Secondary | ICD-10-CM

## 2017-10-31 NOTE — Assessment & Plan Note (Signed)
blood pressure control important in reducing the progression of atherosclerotic disease and aneurysmal disease. On appropriate oral medications.  His severe, poorly controlled hypertension previously may have been a major cause of his aneurysmal degeneration.

## 2017-10-31 NOTE — Assessment & Plan Note (Signed)
Continue PPI as already ordered, this medication has been reviewed and there are no changes at this time. Avoidence of caffeine and alcohol Moderate elevation of the head of the bed  

## 2017-10-31 NOTE — Patient Instructions (Signed)
Abdominal Aortic Aneurysm Blood pumps away from the heart through tubes (blood vessels) called arteries. Aneurysms are weak or damaged places in the wall of an artery. It bulges out like a balloon. An abdominal aortic aneurysm happens in the main artery of the body (aorta). It can burst or tear, causing bleeding inside the body. This is an emergency. It needs treatment right away. What are the causes? The exact cause is unknown. Things that could cause this problem include:  Fat and other substances building up in the lining of a tube.  Swelling of the walls of a blood vessel.  Certain tissue diseases.  Belly (abdominal) trauma.  An infection in the main artery of the body.  What increases the risk? There are things that make it more likely for you to have an aneurysm. These include:  Being over the age of 64 years old.  Having high blood pressure (hypertension).  Being a male.  Being white.  Being very overweight (obese).  Having a family history of aneurysm.  Using tobacco products.  What are the signs or symptoms? Symptoms depend on the size of the aneurysm and how fast it grows. There may not be symptoms. If symptoms occur, they can include:  Pain (belly, side, lower back, or groin).  Feeling full after eating a small amount of food.  Feeling sick to your stomach (nauseous), throwing up (vomiting), or both.  Feeling a lump in your belly that feels like it is beating (pulsating).  Feeling like you will pass out (faint).  How is this treated?  Medicine to control blood pressure and pain.  Imaging tests to see if the aneurysm gets bigger.  Surgery. How is this prevented? To lessen your chance of getting this condition:  Stop smoking. Stop chewing tobacco.  Limit or avoid alcohol.  Keep your blood pressure, blood sugar, and cholesterol within normal limits.  Eat less salt.  Eat foods low in saturated fats and cholesterol. These are found in animal and  whole dairy products.  Eat more fiber. Fiber is found in whole grains, vegetables, and fruits.  Keep a healthy weight.  Stay active and exercise often.  This information is not intended to replace advice given to you by your health care provider. Make sure you discuss any questions you have with your health care provider. Document Released: 06/18/2012 Document Revised: 07/30/2015 Document Reviewed: 03/23/2012 Elsevier Interactive Patient Education  2017 [employer] Inc[employer]  

## 2017-10-31 NOTE — Assessment & Plan Note (Signed)
As part of his work-up he underwent a duplex ultrasound which I have independently reviewed.  This shows an approximately 3.2 cm abdominal aortic aneurysm in the infrarenal aorta.  No surgery or intervention at this time. The patient has an asymptomatic abdominal aortic aneurysm that is less than 4 cm in maximal diameter.  I have discussed the natural history of abdominal aortic aneurysm and the small risk of rupture for aneurysm less than 5 cm in size.  However, as these small aneurysms tend to enlarge over time, continued surveillance with ultrasound or CT scan is mandatory.  I have also discussed optimizing medical management with hypertension and lipid control and the importance of abstinence from tobacco.  The patient is also encouraged to exercise a minimum of 30 minutes 4 times a week.  Should the patient develop new onset abdominal or back pain or signs of peripheral embolization they are instructed to seek medical attention immediately and to alert the physician providing care that they have an aneurysm.  The patient voices their understanding. The patient will return in 12 months with an aortic duplex.

## 2017-10-31 NOTE — Progress Notes (Signed)
Patient ID: Arthur Schroeder, male   DOB: [birth year], 62 y.o.   MRN: [id number]  Chief Complaint  Patient presents with  . New Patient (Initial Visit)    AAA    HPI Arthur Schroeder is a 62 y.o. male.  I am asked to see the patient by C. Kathryne Hitch, NP for evaluation of AAA on a recent US.  The patient reports intermittent right upper abdominal and flank pain.  There is no clear inciting event or causative factor that started the symptoms.  This has been going on now for months without a clear cause.  It comes and goes and there is no specific thing he does to seem to make it go away. As part of his work-up he underwent a duplex ultrasound which I have independently reviewed.  This shows an approximately 3.2 cm abdominal aortic aneurysm in the infrarenal aorta.  He does not smoke.  He says several years ago his blood pressure was terribly controlled with most readings being near 200 systolic but he says nowadays his blood pressure has been good mostly in the 120s and 130s.  Past Medical History:  Diagnosis Date  . AAA (abdominal aortic aneurysm) (HCC)   . Alpha+ thalassemia   . Barrett's esophagus   . Erosive gastritis   . Gastropathy   . GERD (gastroesophageal reflux disease)   . Hepatitis    chronic hep.B of low infectivity  . Hypertension     Past Surgical History:  Procedure Laterality Date  . BACK SURGERY    . COLONOSCOPY WITH PROPOFOL N/A 01/31/2017   Procedure: COLONOSCOPY WITH PROPOFOL;  Surgeon: Toledo, Boykin Nearing, MD;  Location: [facility] ENDOSCOPY;  Service: Endoscopy;  Laterality: N/A;  . ESOPHAGOGASTRODUODENOSCOPY (EGD) WITH PROPOFOL N/A 01/31/2017   Procedure: ESOPHAGOGASTRODUODENOSCOPY (EGD) WITH PROPOFOL;  Surgeon: Toledo, Boykin Nearing, MD;  Location: [facility] ENDOSCOPY;  Service: Endoscopy;  Laterality: N/A;    Family History No bleeding disorders, clotting disorders, porphyria, or aneurysms  Social History Social History   Tobacco Use  . Smoking status: Current Some Day Smoker  .  Smokeless tobacco: Never Used  Substance Use Topics  . Alcohol use: No  . Drug use: No     No Known Allergies  Current Outpatient Medications  Medication Sig Dispense Refill  . carvedilol (COREG) 25 MG tablet Take 25 mg by mouth 2 (two) times daily with a meal.    . folic acid (FOLVITE) 1 MG tablet Take 1 mg by mouth daily.    . hydrochlorothiazide (HYDRODIURIL) 25 MG tablet Take 25 mg by mouth daily.    Marland Kitchen lisinopril (PRINIVIL,ZESTRIL) 30 MG tablet Take 30 mg by mouth 2 (two) times daily.     Marland Kitchen omeprazole (PRILOSEC) 20 MG capsule Take 20 mg by mouth daily.    Marland Kitchen tenofovir (VIREAD) 300 MG tablet Take 300 mg by mouth daily.    Marland Kitchen aspirin EC 81 MG tablet Take 81 mg by mouth daily.    Marland Kitchen ibuprofen (ADVIL,MOTRIN) 800 MG tablet Take 1 tablet (800 mg total) by mouth every 8 (eight) hours as needed. (Patient not taking: Reported on 10/31/2017) 30 tablet 0  . methocarbamol (ROBAXIN-750) 750 MG tablet Take 1 tablet (750 mg total) by mouth 4 (four) times daily. (Patient not taking: Reported on 01/31/2017) 20 tablet 0  . polyethylene glycol-electrolytes (NULYTELY/GOLYTELY) 420 g solution Take 4,000 mLs by mouth once.     No current facility-administered medications for this visit.       REVIEW OF SYSTEMS (Negative  unless checked)  Constitutional: [] Weight loss  [] Fever  [] Chills Cardiac: [] Chest pain   [] Chest pressure   [] Palpitations   [] Shortness of breath when laying flat   [] Shortness of breath at rest   [] Shortness of breath with exertion. Vascular:  [] Pain in legs with walking   [] Pain in legs at rest   [] Pain in legs when laying flat   [] Claudication   [] Pain in feet when walking  [] Pain in feet at rest  [] Pain in feet when laying flat   [] History of DVT   [] Phlebitis   [] Swelling in legs   [] Varicose veins   [] Non-healing ulcers Pulmonary:   [] Uses home oxygen   [] Productive cough   [] Hemoptysis   [] Wheeze  [] COPD   [] Asthma Neurologic:  [] Dizziness  [] Blackouts   [] Seizures   [] History of  stroke   [] History of TIA  [] Aphasia   [] Temporary blindness   [] Dysphagia   [] Weakness or numbness in arms   [] Weakness or numbness in legs Musculoskeletal:  [x] Arthritis   [] Joint swelling   [] Joint pain   [] Low back pain Hematologic:  [] Easy bruising  [] Easy bleeding   [] Hypercoagulable state   [x] Anemic  [] Hepatitis Gastrointestinal:  [] Blood in stool   [] Vomiting blood  [x] Gastroesophageal reflux/heartburn   [x] Abdominal pain Genitourinary:  [] Chronic kidney disease   [] Difficult urination  [] Frequent urination  [] Burning with urination   [] Hematuria Skin:  [] Rashes   [] Ulcers   [] Wounds Psychological:  [] History of anxiety   []  History of major depression.    Physical Exam BP 131/80 (BP Location: Right Arm, Patient Position: Sitting)   Pulse 62   Resp 12   Ht 5\' 8"  (1.727 m)   Wt 176 lb (79.8 kg)   BMI 26.76 kg/m  Gen:  WD/WN, NAD Head: [place]/AT, No temporalis wasting. Ear/Nose/Throat: Hearing grossly intact, nares w/o erythema or drainage, oropharynx w/o Erythema/Exudate Eyes: Conjunctiva clear, sclera non-icteric  Neck: trachea midline.  No bruit or JVD.  Pulmonary:  Good air movement, clear to auscultation bilaterally.  Cardiac: RRR, normal S1, S2, no Murmurs, rubs or gallops. Vascular:  Vessel Right Left  Radial Palpable Palpable                          PT Palpable Palpable  DP Palpable Palpable   Gastrointestinal: soft, non-tender/non-distended.  No increased aortic impulse.  Aorta is not tender Musculoskeletal: M/S 5/5 throughout.  Extremities without ischemic changes.  No deformity or atrophy. No edema. Neurologic: Sensation grossly intact in extremities.  Symmetrical.  Speech is fluent. Motor exam as listed above. Psychiatric: Judgment intact, Mood & affect appropriate for pt's clinical situation. Dermatologic: No rashes or ulcers noted.  No cellulitis or open wounds.   Radiology US Abdomen Complete  Result Date: 10/17/2017 CLINICAL DATA:  Chronic hepatitis.  EXAM: ABDOMEN ULTRASOUND COMPLETE COMPARISON:  11/21/2016 FINDINGS: Gallbladder: No gallstones or wall thickening visualized. No sonographic Murphy sign noted by sonographer. Common bile duct: Diameter: 5.7 mm Liver: Increased hepatic echogenicity. No focal hepatic mass. Portal vein is patent on color Doppler imaging with normal direction of blood flow towards the liver. IVC: No abnormality visualized. Pancreas: Limited visualization secondary to overlying bowel gas. Spleen: Splenic length measures 11.1 cm.  Splenic volume 444.4 mL. Right Kidney: Length: 10.1 cm. Echogenicity within normal limits. No mass or hydronephrosis visualized. Left Kidney: Length: 9.1 cm. Echogenicity within normal limits. No mass or hydronephrosis visualized. Abdominal aorta: Abdominal aortic aneurysm measuring 3.2 cm. Other findings: No  ascites. IMPRESSION: 1. Increased hepatic echogenicity as can be seen with hepatocellular disease such as hepatitis. 2. Abdominal aortic aneurysm measuring 3.2 cm. Abdominal Aortic Aneurysm (ICD10-I71.9). Recommend follow-up aortic ultrasound in 3 years. This recommendation follows ACR consensus guidelines: White Paper of the ACR Incidental Findings Committee II on Vascular Findings. J Am Coll Radiol 2013; 10:789-794. Electronically Signed   By: Elige KoHetal  Patel   On: 10/17/2017 09:59    Labs No results found for this or any previous visit (from the past 2160 hour(s)).  Assessment/Plan:  Hypertension blood pressure control important in reducing the progression of atherosclerotic disease and aneurysmal disease. On appropriate oral medications.  His severe, poorly controlled hypertension previously may have been a major cause of his aneurysmal degeneration.   GERD (gastroesophageal reflux disease) Continue PPI as already ordered, this medication has been reviewed and there are no changes at this time.  Avoidence of caffeine and alcohol  Moderate elevation of the head of the bed   AAA (abdominal  aortic aneurysm) (HCC) As part of his work-up he underwent a duplex ultrasound which I have independently reviewed.  This shows an approximately 3.2 cm abdominal aortic aneurysm in the infrarenal aorta.  No surgery or intervention at this time. The patient has an asymptomatic abdominal aortic aneurysm that is less than 4 cm in maximal diameter.  I have discussed the natural history of abdominal aortic aneurysm and the small risk of rupture for aneurysm less than 5 cm in size.  However, as these small aneurysms tend to enlarge over time, continued surveillance with ultrasound or CT scan is mandatory.  I have also discussed optimizing medical management with hypertension and lipid control and the importance of abstinence from tobacco.  The patient is also encouraged to exercise a minimum of 30 minutes 4 times a week.  Should the patient develop new onset abdominal or back pain or signs of peripheral embolization they are instructed to seek medical attention immediately and to alert the physician providing care that they have an aneurysm.  The patient voices their understanding. The patient will return in 12 months with an aortic duplex.       Festus BarrenJason Dew 10/31/2017, 1:53 PM   This note was created with Dragon medical transcription system.  Any errors from dictation are unintentional.

## 2018-09-08 IMAGING — US US ABDOMEN COMPLETE
1 series · 13 of 25 positions shown · non-contrast
Comparison: Abdominal ultrasound March 26, 2015

CLINICAL DATA: Chronic hepatitis-B

EXAM:
ABDOMEN ULTRASOUND COMPLETE

[Series 1: us abdomen complete · 0.23mm/px · 13 of 90 slices shown]
[im 1/90]
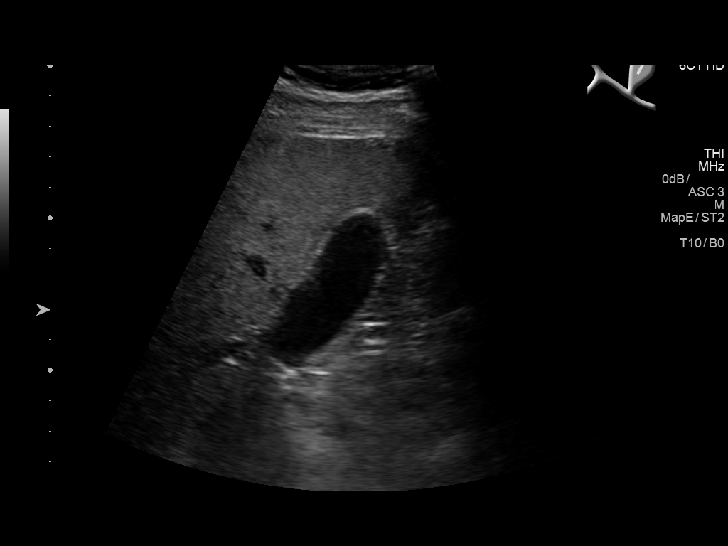
[im 8/90]
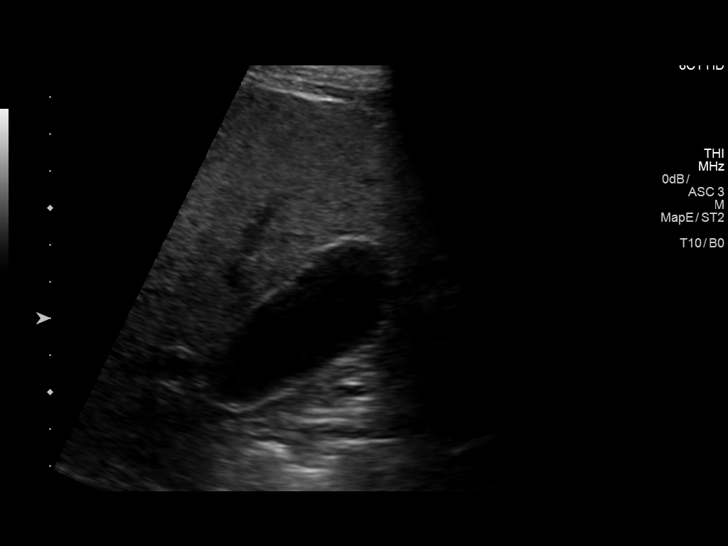
[im 15/90]
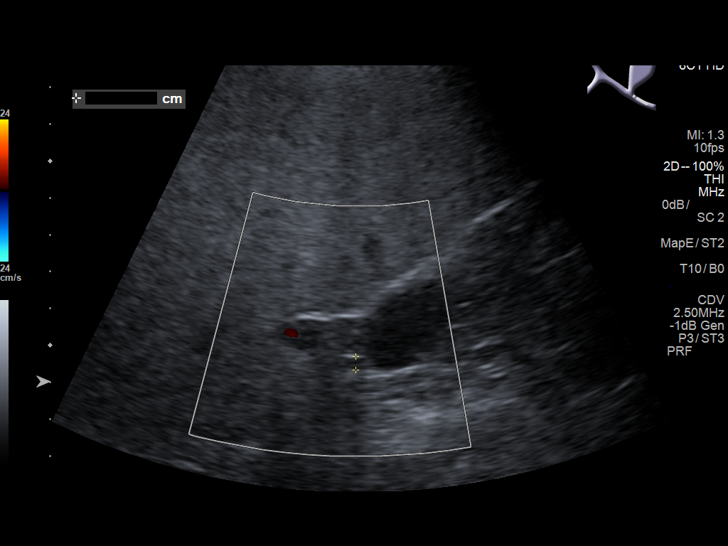
[im 23/90]
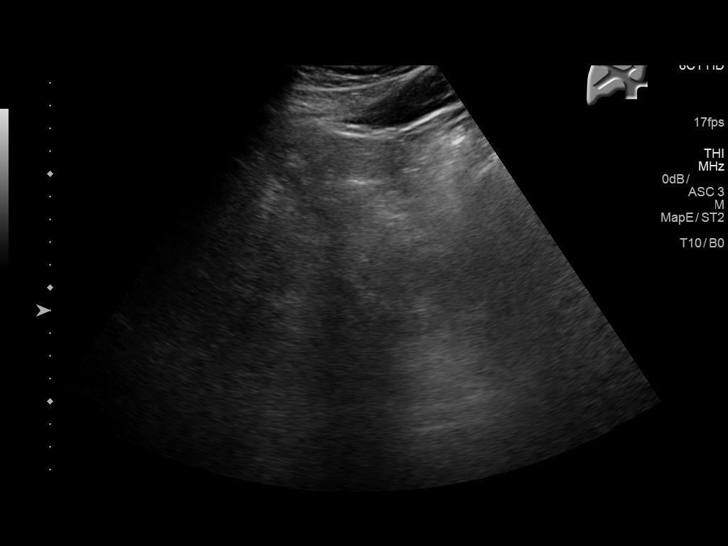
[im 30/90]
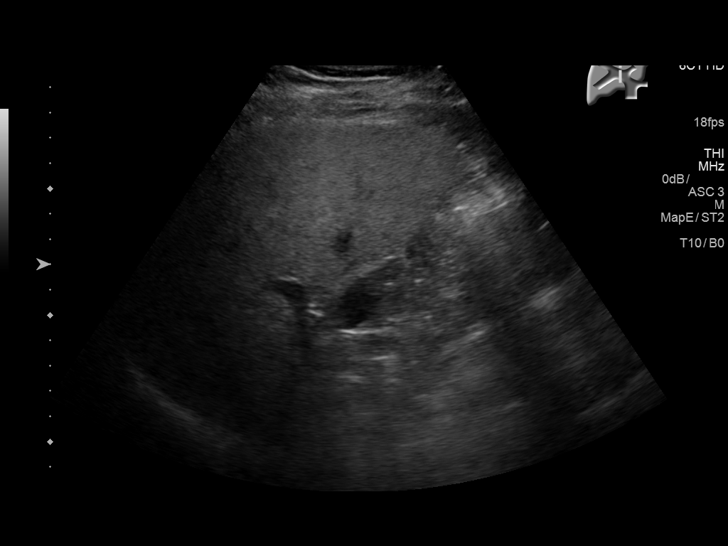
[im 38/90]
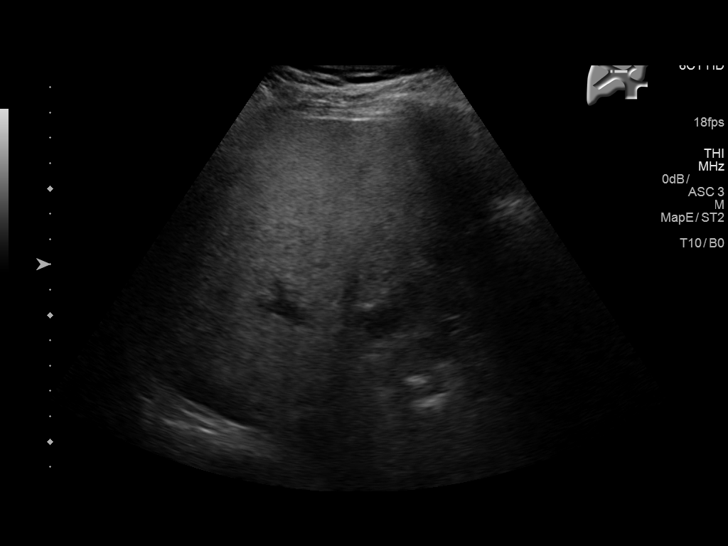
[im 45/90]
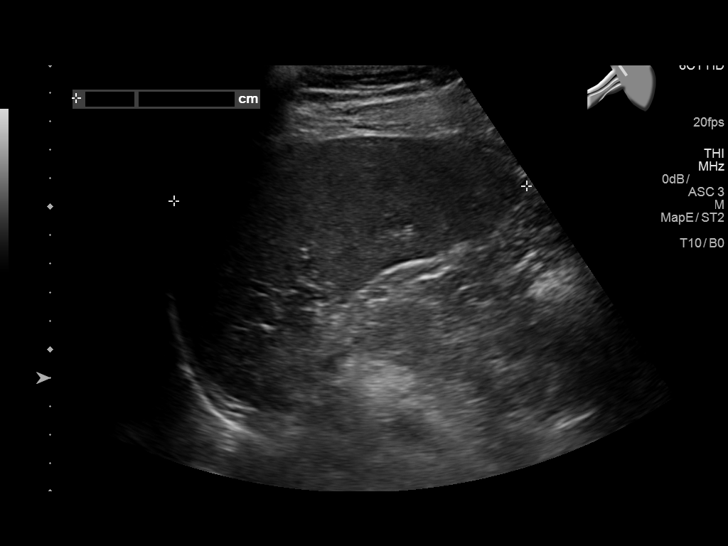
[im 52/90]
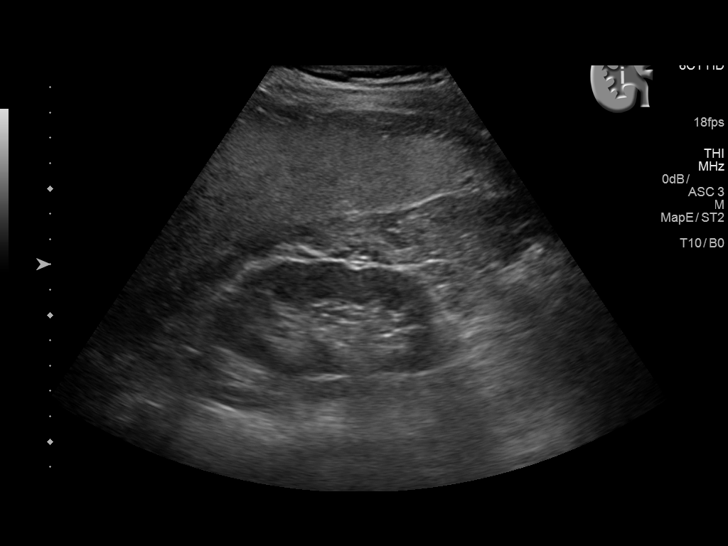
[im 60/90]
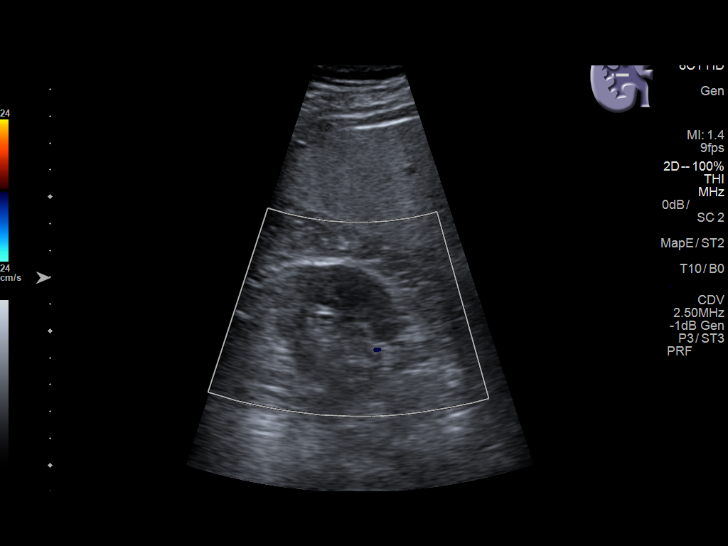
[im 67/90]
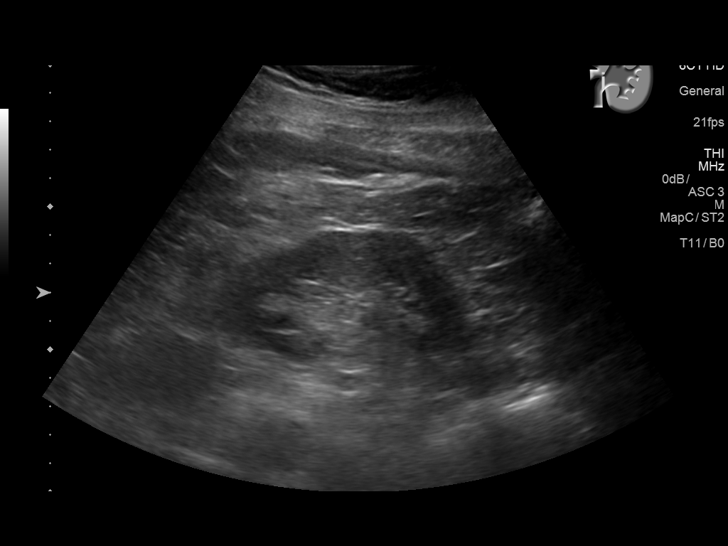
[im 75/90]
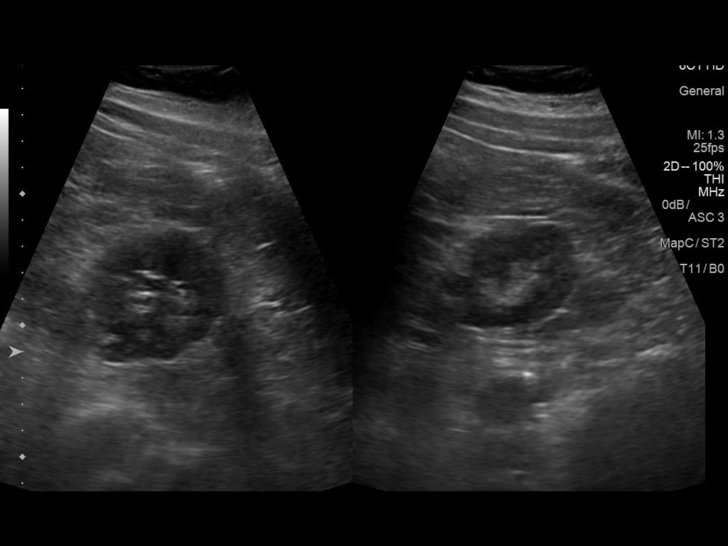
[im 82/90]
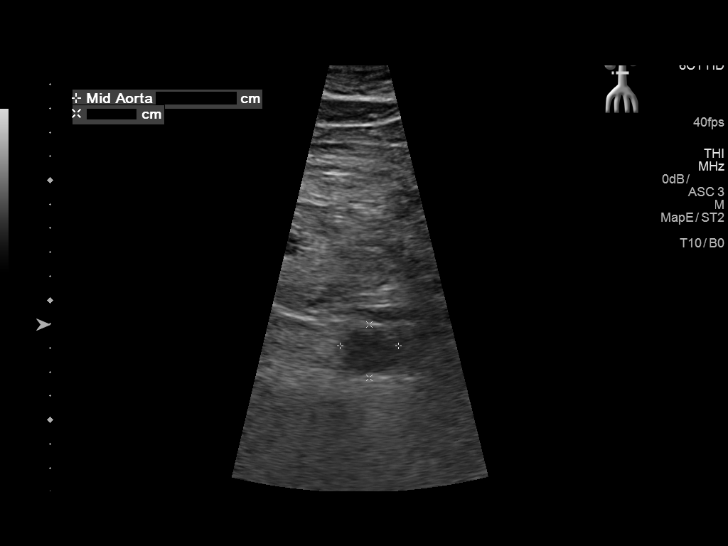
[im 90/90]
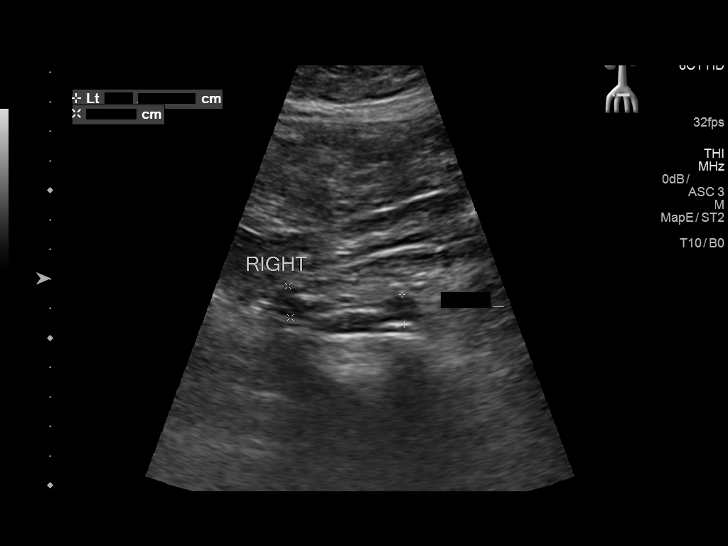

[13 of 25 positions shown; findings below may reference images not displayed]

FINDINGS: The study is limited overall due to excessive bowel gas.

Gallbladder: No gallstones or wall thickening visualized. No
sonographic Murphy sign noted by sonographer.

Common bile duct: Diameter: 3.7 mm

Liver: The hepatic echotexture is mildly increased diffusely. Bowel
gas in the midline limits visualization of portions of the liver. No
focal mass or ductal dilation is observed.

IVC: Limited by bowel gas.

Pancreas: Obscured by bowel gas.

Spleen: There is mild splenomegaly. The calculated volume is 489 cc.

Right Kidney: Length: 9.1 cm. Echogenicity within normal limits. No
mass or hydronephrosis visualized.

Left Kidney: Length: 10.1 cm. Echogenicity within normal limits. No
mass or hydronephrosis visualized.

Abdominal aorta: There is failure to taper the caliber of the
abdominal aorta but images are limited. The distal aorta appears to
measure similar to that of the proximal aorta at 2.9 cm.

Other findings: No ascites is observed.
IMPRESSION: 1. Increased hepatic echotexture consistent with fatty infiltration
or hepatocellular disease. No focal hepatic mass or ductal dilation.
Some images of the liver are limited by bowel gas. Normal appearing
gallbladder.
2. Mild splenomegaly with calculated volume of 489 cc. Previously it
was 387 cc.
3. Ectatic abdominal aorta. A known 3.2 cm aneurysm is not
demonstrated due to excessive bowel gas.

## 2018-10-09 ENCOUNTER — Other Ambulatory Visit: Payer: Self-pay | Admitting: Gastroenterology

## 2018-10-09 ENCOUNTER — Other Ambulatory Visit (HOSPITAL_COMMUNITY): Payer: Self-pay | Admitting: Gastroenterology

## 2018-10-09 DIAGNOSIS — B181 Chronic viral hepatitis B without delta-agent: Secondary | ICD-10-CM

## 2018-10-17 ENCOUNTER — Ambulatory Visit
Admission: RE | Admit: 2018-10-17 | Discharge: 2018-10-17 | Disposition: A | Discharge status: Home / Self Care | Payer: Medicare Other | Source: Ambulatory Visit | Attending: Gastroenterology | Admitting: Gastroenterology

## 2018-10-17 ENCOUNTER — Other Ambulatory Visit: Payer: Self-pay

## 2018-10-17 DIAGNOSIS — K7460 Unspecified cirrhosis of liver: Secondary | ICD-10-CM | POA: Insufficient documentation

## 2018-10-17 DIAGNOSIS — B181 Chronic viral hepatitis B without delta-agent: Secondary | ICD-10-CM | POA: Insufficient documentation

## 2018-11-02 ENCOUNTER — Encounter (INDEPENDENT_AMBULATORY_CARE_PROVIDER_SITE_OTHER): Payer: Self-pay | Admitting: Vascular Surgery

## 2018-11-02 ENCOUNTER — Ambulatory Visit (INDEPENDENT_AMBULATORY_CARE_PROVIDER_SITE_OTHER): Payer: Medicare Other | Admitting: Vascular Surgery

## 2018-11-02 ENCOUNTER — Ambulatory Visit (INDEPENDENT_AMBULATORY_CARE_PROVIDER_SITE_OTHER): Payer: Medicare Other

## 2018-11-02 ENCOUNTER — Other Ambulatory Visit: Payer: Self-pay

## 2018-11-02 VITALS — BP 131/86 | HR 65 | Resp 16 | Ht 68.0 in | Wt 179.0 lb

## 2018-11-02 DIAGNOSIS — I714 Abdominal aortic aneurysm, without rupture, unspecified: Secondary | ICD-10-CM

## 2018-11-02 DIAGNOSIS — F17200 Nicotine dependence, unspecified, uncomplicated: Secondary | ICD-10-CM | POA: Insufficient documentation

## 2018-11-02 DIAGNOSIS — I10 Essential (primary) hypertension: Secondary | ICD-10-CM

## 2018-11-02 DIAGNOSIS — K2100 Gastro-esophageal reflux disease with esophagitis, without bleeding: Secondary | ICD-10-CM

## 2018-11-02 NOTE — Assessment & Plan Note (Signed)

## 2018-11-02 NOTE — Progress Notes (Signed)
MRN : [id number]  Arthur Schroeder is a 63 y.o. (08/31/1955) male who presents with chief complaint of  Chief Complaint  Patient presents with  . Follow-up    ultrasound follow up  .  History of Present Illness: Patient returns today in follow up of his AAA.  He does still smoke "sometimes". He has no abdominal pain or back pain.  No signs of peripheral embolization.  His duplex today shows a stable 3.1 cm AAA with iliac aneurysms measuring 1.8 on the right and 2.0 on the left.  This is unchanged from previous study.  Current Outpatient Medications  Medication Sig Dispense Refill  . folic acid (FOLVITE) 1 MG tablet Take 1 mg by mouth daily.    . hydrochlorothiazide (HYDRODIURIL) 25 MG tablet Take 25 mg by mouth daily.    Marland Kitchen. lisinopril (PRINIVIL,ZESTRIL) 30 MG tablet Take 30 mg by mouth 2 (two) times daily.     Marland Kitchen. omeprazole (PRILOSEC) 20 MG capsule Take 20 mg by mouth daily.    Marland Kitchen. tenofovir (VIREAD) 300 MG tablet Take 300 mg by mouth daily.    Marland Kitchen. aspirin EC 81 MG tablet Take 81 mg by mouth daily.    . carvedilol (COREG) 25 MG tablet Take 25 mg by mouth 2 (two) times daily with a meal.    . ibuprofen (ADVIL,MOTRIN) 800 MG tablet Take 1 tablet (800 mg total) by mouth every 8 (eight) hours as needed. (Patient not taking: Reported on 10/31/2017) 30 tablet 0  . methocarbamol (ROBAXIN-750) 750 MG tablet Take 1 tablet (750 mg total) by mouth 4 (four) times daily. (Patient not taking: Reported on 01/31/2017) 20 tablet 0  . polyethylene glycol-electrolytes (NULYTELY/GOLYTELY) 420 g solution Take 4,000 mLs by mouth once.     No current facility-administered medications for this visit.     Past Medical History:  Diagnosis Date  . AAA (abdominal aortic aneurysm) (HCC)   . Alpha+ thalassemia   . Barrett's esophagus   . Erosive gastritis   . Gastropathy   . GERD (gastroesophageal reflux disease)   . Hepatitis    chronic hep.B of low infectivity  . Hypertension     Past Surgical History:   Procedure Laterality Date  . BACK SURGERY    . COLONOSCOPY WITH PROPOFOL N/A 01/31/2017   Procedure: COLONOSCOPY WITH PROPOFOL;  Surgeon: Toledo, Boykin Nearingeodoro K, MD;  Location: [facility] ENDOSCOPY;  Service: Endoscopy;  Laterality: N/A;  . ESOPHAGOGASTRODUODENOSCOPY (EGD) WITH PROPOFOL N/A 01/31/2017   Procedure: ESOPHAGOGASTRODUODENOSCOPY (EGD) WITH PROPOFOL;  Surgeon: Toledo, Boykin Nearingeodoro K, MD;  Location: [facility] ENDOSCOPY;  Service: Endoscopy;  Laterality: N/A;    Social History Social History   Tobacco Use  . Smoking status: Current Some Day Smoker  . Smokeless tobacco: Never Used  Substance Use Topics  . Alcohol use: No  . Drug use: No    Family History No bleeding or clotting disorders  No Known Allergies   REVIEW OF SYSTEMS (Negative unless checked)  Constitutional: [] ?Weight loss  [] ?Fever  [] ?Chills Cardiac: [] ?Chest pain   [] ?Chest pressure   [] ?Palpitations   [] ?Shortness of breath when laying flat   [] ?Shortness of breath at rest   [] ?Shortness of breath with exertion. Vascular:  [] ?Pain in legs with walking   [] ?Pain in legs at rest   [] ?Pain in legs when laying flat   [] ?Claudication   [] ?Pain in feet when walking  [] ?Pain in feet at rest  [] ?Pain in feet when laying flat   [] ?History of DVT   [] ?Phlebitis   [] ?  Swelling in legs   [] ?Varicose veins   [] ?Non-healing ulcers Pulmonary:   [] ?Uses home oxygen   [] ?Productive cough   [] ?Hemoptysis   [] ?Wheeze  [] ?COPD   [] ?Asthma Neurologic:  [] ?Dizziness  [] ?Blackouts   [] ?Seizures   [] ?History of stroke   [] ?History of TIA  [] ?Aphasia   [] ?Temporary blindness   [] ?Dysphagia   [] ?Weakness or numbness in arms   [] ?Weakness or numbness in legs Musculoskeletal:  [x] ?Arthritis   [] ?Joint swelling   [] ?Joint pain   [] ?Low back pain Hematologic:  [] ?Easy bruising  [] ?Easy bleeding   [] ?Hypercoagulable state   [x] ?Anemic  [] ?Hepatitis Gastrointestinal:  [] ?Blood in stool   [] ?Vomiting blood  [x] ?Gastroesophageal reflux/heartburn   [x] ?Abdominal  pain Genitourinary:  [] ?Chronic kidney disease   [] ?Difficult urination  [] ?Frequent urination  [] ?Burning with urination   [] ?Hematuria Skin:  [] ?Rashes   [] ?Ulcers   [] ?Wounds Psychological:  [] ?History of anxiety   [] ? History of major depression.   Physical Examination  BP 131/86 (BP Location: Right Arm)   Pulse 65   Resp 16   Ht 5\' 8"  (1.727 m)   Wt 179 lb (81.2 kg)   BMI 27.22 kg/m  Gen:  WD/WN, NAD Head: [place]/AT, No temporalis wasting. Ear/Nose/Throat: Hearing grossly intact, nares w/o erythema or drainage Eyes: Conjunctiva clear. Sclera non-icteric Neck: Supple.  Trachea midline Pulmonary:  Good air movement, no use of accessory muscles.  Cardiac: RRR, no JVD Vascular:  Vessel Right Left  Radial Palpable Palpable                          PT Palpable Palpable  DP Palpable Palpable   Gastrointestinal: soft, non-tender/non-distended. No increased aortic impulse Musculoskeletal: M/S 5/5 throughout.  No deformity or atrophy. No edema. Neurologic: Sensation grossly intact in extremities.  Symmetrical.  Speech is fluent.  Psychiatric: Judgment intact, Mood & affect appropriate for pt's clinical situation. Dermatologic: No rashes or ulcers noted.  No cellulitis or open wounds.       Labs No results found for this or any previous visit (from the past 2160 hour(s)).  Radiology US Abdomen Complete  Result Date: 10/17/2018 CLINICAL DATA:  Chronic hepatitis-B infection. EXAM: ABDOMEN ULTRASOUND COMPLETE COMPARISON:  Abdominal ultrasound dated October 17, 2017. FINDINGS: Gallbladder: No gallstones or wall thickening visualized. No sonographic Murphy sign noted by sonographer. Common bile duct: Diameter: 3 mm, normal. Liver: No focal lesion identified. Unchanged increased parenchymal echogenicity. Portal vein is patent on color Doppler imaging with normal direction of blood flow towards the liver. IVC: No abnormality visualized. Pancreas: Visualized portion unremarkable.  Spleen: Splenic length measures 12.7 cm. Appearance within normal limits. Right Kidney: Length: 9.8 cm. Echogenicity within normal limits. No mass or hydronephrosis visualized. 1.5 cm simple cyst in the midpole. Left Kidney: Length: 10.0 cm. Echogenicity within normal limits. No mass or hydronephrosis visualized. Abdominal aorta: Infrarenal aneurysmal dilatation again noted, measuring 3.0 cm on today's study, previously 3.2 cm. Other findings: None. IMPRESSION: 1. Unchanged increased hepatic parenchymal echogenicity as can be seen with underlying liver disease. No focal lesion. 2. Borderline splenomegaly. 3. Unchanged infrarenal abdominal aortic aneurysm. Recommend followup by ultrasound in 3 years. This recommendation follows ACR consensus guidelines: White Paper of the ACR Incidental Findings Committee II on Vascular Findings. J Am Coll Radiol 2013; 10:789-794. Aortic aneurysm NOS (ICD10-I71.9) Electronically Signed   By: Obie Dredge M.D.   On: 10/17/2018 14:21    Assessment/Plan Hypertension blood pressure control important in reducing the progression of  atherosclerotic disease and aneurysmal disease. On appropriate oral medications.  His severe, poorly controlled hypertension previously may have been a major cause of his aneurysmal degeneration.   GERD (gastroesophageal reflux disease) Continue PPI as already ordered, this medication has been reviewed and there are no changes at this time.  Avoidence of caffeine and alcohol  Moderate elevation of the head of the bed   Tobacco use disorder We had a discussion for approximately 3 minutes regarding the absolute need for smoking cessation due to the deleterious nature of tobacco on the vascular system. We discussed the tobacco use would diminish patency of any intervention, and likely significantly worsen progressio of disease. We discussed multiple agents for quitting including replacement therapy or medications to reduce cravings such as  Chantix. The patient voices their understanding of the importance of smoking cessation.   AAA (abdominal aortic aneurysm) ([place]) His duplex today shows a stable 3.1 cm AAA with iliac aneurysms measuring 1.8 on the right and 2.0 on the left.  This is unchanged from previous study. Discussed the importance of smoking cessation.  Blood pressure control seems to be pretty good.  Recheck in 1 year.    Leotis Pain, MD  11/02/2018 9:13 AM    This note was created with Dragon medical transcription system.  Any errors from dictation are purely unintentional

## 2018-11-02 NOTE — Assessment & Plan Note (Signed)
His duplex today shows a stable 3.1 cm AAA with iliac aneurysms measuring 1.8 on the right and 2.0 on the left.  This is unchanged from previous study. Discussed the importance of smoking cessation.  Blood pressure control seems to be pretty good.  Recheck in 1 year.

## 2018-11-02 NOTE — Patient Instructions (Signed)
Abdominal Aortic Aneurysm  An aneurysm is a bulge in an artery. It happens when blood pushes against a weakened or damaged artery wall. An abdominal aortic aneurysm (AAA) is an aneurysm that occurs in the lower part of the aorta, which is the main artery of the body. The aorta supplies blood from the heart to the rest of the body. Some aneurysms may not cause symptoms. However, an AAA can cause two serious problems:  It can enlarge and burst (rupture).  It can cause blood to flow between the layers of the wall of the aorta through a tear (aortic dissection). Both of these problems are medical emergencies. They can cause bleeding inside the body. If they are not diagnosed and treated right away, they can be life-threatening. What are the causes? The exact cause of this condition is not known. What increases the risk? The following factors may make you more likely to develop this condition:  Being a male 60 years of age or older.  Being Caucasian.  Using tobacco or having a history of tobacco use.  Having a family history of aneurysms.  Having any of the following conditions: ? Hardening of the arteries (arteriosclerosis). ? Inflammation of the walls of an artery (arteritis). ? Certain genetic conditions. ? Obesity. ? An infection in the wall of the aorta (infectious aortitis) caused by bacteria. ? High cholesterol. ? High blood pressure (hypertension). What are the signs or symptoms? Symptoms of this condition vary depending on the size of the aneurysm and how fast it is growing. Most aneurysms grow slowly and do not cause any symptoms. When symptoms do occur, they may include:  Pain in the abdomen, side, or lower back.  Feeling full after eating only small amounts of food.  Feeling a pulsating lump in the abdomen. Symptoms that the aneurysm has ruptured include:  A sudden onset of severe pain in the abdomen, side, or back.  Nausea or vomiting.  Feeling light-headed or  passing out. How is this diagnosed? This condition may be diagnosed with:  A physical exam to check for throbbing and to listen to blood flow in your abdomen.  Tests, such as: ? Ultrasound. ? X-rays. ? CT scan. ? MRI. ? Tests to check your arteries for damage or blockage (angiogram). Because most unruptured AAAs cause no symptoms, they are often found during exams for other conditions. How is this treated? Treatment for this condition depends on:  The size of the aneurysm.  How fast the aneurysm is growing.  Your age.  Risk factors for rupture. If your aneurysm is smaller than 2 inches (5 cm), your health care provider may manage it by:  Checking (monitoring) it regularly to see if it is getting bigger. Depending on the size of the aneurysm, how fast it is growing, and your other risk factors, you may have an ultrasound to monitor it every 3-6 months, every year, or every few years.  Giving you medicines to control blood pressure, treat pain, or fight infection. If your aneurysm is larger than 2 inches (5 cm), your health care provider may do surgery to repair it. Follow these instructions at home: Lifestyle  Do not use any products that contain nicotine or tobacco, such as cigarettes, e-cigarettes, and chewing tobacco. If you need help quitting, ask your health care provider.  Stay physically active and exercise regularly. Talk with your health care provider about how often you should exercise and which types of exercise are safe for you. Eating and drinking    Eat a heart-healthy diet. This includes plenty of fresh fruits and vegetables, whole grains, low-fat (lean) protein, and low-fat dairy products.  Avoid foods that are high in saturated fat and cholesterol, such as red meat and some dairy products.  Do not drink alcohol if: ? Your health care provider tells you not to drink. ? You are pregnant, may be pregnant, or are planning to become pregnant.  If you drink  alcohol: ? Limit how much you use to:  0-1 drink a day for women.  0-2 drinks a day for men. ? Be aware of how much alcohol is in your drink. In the [place] one drink equals one typical bottle of beer (12 oz), one-half glass of wine (5 oz), or one shot of hard liquor (1 oz). General instructions  Take over-the-counter and prescription medicines only as told by your health care provider.  Keep your blood pressure within a normal range. Check it regularly, and ask your health care provider what your target blood pressure should be.  Have your blood sugar (glucose) level and cholesterol levels checked regularly. Follow your health care provider's instructions on how to keep levels within normal limits.  Avoid heavy lifting and activities that take a lot of effort (are strenuous). Ask your health care provider what activities are safe for you.  Keep all follow-up visits as told by your health care provider. This is important. Contact a health care provider if you:  Have pain in your abdomen, side, or back.  Have a throbbing feeling in your abdomen. Get help right away if you:  Have sudden, severe pain in your abdomen, side, or back.  Experience nausea or vomiting.  Have constipation or problems urinating.  Feel light-headed.  Have a rapid heart rate when you stand.  Have sweaty, clammy skin.  Have shortness of breath.  Have a fever. These symptoms may represent a serious problem that is an emergency. Do not wait to see if the symptoms will go away. Get medical help right away. Call your local emergency services (911 in the [place] Do not drive yourself to the hospital. Summary  An aneurysm is a bulge in an artery. It happens when blood pushes against a weakened or damaged artery wall.  Being older, male, Caucasian, having a history of tobacco use, and a family history of aneurysms can increase the risk.  These problems can cause bleeding inside the body and can be  life-threatening. Get medical help right away. This information is not intended to replace advice given to you by your health care provider. Make sure you discuss any questions you have with your health care provider. Document Released: 12/01/2004 Document Revised: 06/11/2018 Document Reviewed: 09/30/2017 Elsevier Patient Education  2020 Elsevier Inc.  

## 2019-11-05 ENCOUNTER — Ambulatory Visit (INDEPENDENT_AMBULATORY_CARE_PROVIDER_SITE_OTHER): Payer: Medicare Other

## 2019-11-05 ENCOUNTER — Encounter (INDEPENDENT_AMBULATORY_CARE_PROVIDER_SITE_OTHER): Payer: Self-pay | Admitting: Vascular Surgery

## 2019-11-05 ENCOUNTER — Ambulatory Visit (INDEPENDENT_AMBULATORY_CARE_PROVIDER_SITE_OTHER): Payer: Medicare Other | Admitting: Vascular Surgery

## 2019-11-05 ENCOUNTER — Other Ambulatory Visit: Payer: Self-pay

## 2019-11-05 VITALS — BP 134/87 | HR 64 | Ht 68.0 in | Wt 177.0 lb

## 2019-11-05 DIAGNOSIS — I10 Essential (primary) hypertension: Secondary | ICD-10-CM

## 2019-11-05 DIAGNOSIS — I714 Abdominal aortic aneurysm, without rupture, unspecified: Secondary | ICD-10-CM

## 2019-11-05 DIAGNOSIS — K2100 Gastro-esophageal reflux disease with esophagitis, without bleeding: Secondary | ICD-10-CM | POA: Diagnosis not present

## 2019-11-05 NOTE — Progress Notes (Signed)
MRN : [id number]  Arthur Schroeder is a 64 y.o. ([birth year]) male who presents with chief complaint of  Chief Complaint  Patient presents with  . Follow-up    U/s follow up  .  History of Present Illness: Patient returns today in follow up of his AAA and iliac aneurysms. No changes. BP control has been good since his last visit. No AAA symptoms. Specifically, the patient denies new back or abdominal pain, or signs of peripheral embolization. Duplex today shows a stable 3.0 cm AAA with 1.8 cm right iliac aneurysm and 1.7 cm left iliac aneurysm.  No change from previous studies.  Current Outpatient Medications  Medication Sig Dispense Refill  . aspirin EC 81 MG tablet Take 81 mg by mouth daily.     . carvedilol (COREG) 25 MG tablet Take 25 mg by mouth 2 (two) times daily with a meal.     . folic acid (FOLVITE) 1 MG tablet Take 1 mg by mouth daily.     . hydrochlorothiazide (HYDRODIURIL) 25 MG tablet Take 25 mg by mouth daily.    Marland Kitchen ibuprofen (ADVIL,MOTRIN) 800 MG tablet Take 1 tablet (800 mg total) by mouth every 8 (eight) hours as needed. 30 tablet 0  . lisinopril (PRINIVIL,ZESTRIL) 30 MG tablet Take 30 mg by mouth 2 (two) times daily.     . methocarbamol (ROBAXIN-750) 750 MG tablet Take 1 tablet (750 mg total) by mouth 4 (four) times daily. 20 tablet 0  . omeprazole (PRILOSEC) 20 MG capsule Take 20 mg by mouth daily.    . polyethylene glycol-electrolytes (NULYTELY/GOLYTELY) 420 g solution Take 4,000 mLs by mouth once.    Marland Kitchen tenofovir (VIREAD) 300 MG tablet Take 300 mg by mouth daily.     No current facility-administered medications for this visit.    Past Medical History:  Diagnosis Date  . AAA (abdominal aortic aneurysm) (HCC)   . Alpha+ thalassemia   . Barrett's esophagus   . Erosive gastritis   . Gastropathy   . GERD (gastroesophageal reflux disease)   . Hepatitis    chronic hep.B of low infectivity  . Hypertension     Past Surgical History:  Procedure Laterality Date  . BACK  SURGERY    . COLONOSCOPY WITH PROPOFOL N/A 01/31/2017   Procedure: COLONOSCOPY WITH PROPOFOL;  Surgeon: Toledo, Boykin Nearing, MD;  Location: [facility];  Service: Endoscopy;  Laterality: N/A;  . ESOPHAGOGASTRODUODENOSCOPY (EGD) WITH PROPOFOL N/A 01/31/2017   Procedure: ESOPHAGOGASTRODUODENOSCOPY (EGD) WITH PROPOFOL;  Surgeon: Toledo, Boykin Nearing, MD;  Location: [facility] ENDOSCOPY;  Service: Endoscopy;  Laterality: N/A;    Social History       Tobacco Use  . Smoking status: Current Some Day Smoker  . Smokeless tobacco: Never Used  Substance Use Topics  . Alcohol use: No  . Drug use: No    Family History No bleeding or clotting disorders  No Known Allergies   REVIEW OF SYSTEMS(Negative unless checked)  Constitutional: [] ??Weight loss[] ??Fever[] ??Chills Cardiac:[] ??Chest pain[] ??Chest pressure[] ??Palpitations [] ??Shortness of breath when laying flat [] ??Shortness of breath at rest [] ??Shortness of breath with exertion. Vascular: [] ??Pain in legs with walking[] ??Pain in legsat rest[] ??Pain in legs when laying flat [] ??Claudication [] ??Pain in feet when walking [] ??Pain in feet at rest [] ??Pain in feet when laying flat [] ??History of DVT [] ??Phlebitis [] ??Swelling in legs [] ??Varicose veins [] ??Non-healing ulcers Pulmonary: [] ??Uses home oxygen [] ??Productive cough[] ??Hemoptysis [] ??Wheeze [] ??COPD [] ??Asthma Neurologic: [] ??Dizziness [] ??Blackouts [] ??Seizures [] ??History of stroke [] ??History of TIA[] ??Aphasia [] ??Temporary blindness[] ??Dysphagia [] ??Weaknessor numbness in arms [] ??Weakness or numbnessin legs Musculoskeletal: [  x]??Arthritis [] ??Joint swelling [] ??Joint pain [] ??Low back pain Hematologic:[] ??Easy bruising[] ??Easy bleeding [] ??Hypercoagulable state [x] ??Anemic [] ??Hepatitis Gastrointestinal:[] ??Blood in stool[] ??Vomiting blood[x] ??Gastroesophageal  reflux/heartburn[x] ??Abdominal pain Genitourinary: [] ??Chronic kidney disease [] ??Difficulturination [] ??Frequenturination [] ??Burning with urination[] ??Hematuria Skin: [] ??Rashes [] ??Ulcers [] ??Wounds Psychological: [] ??History of anxiety[] ??History of major depression.    Physical Examination  BP 134/87   Pulse 64   Ht 5\' 8"  (1.727 m)   Wt 177 lb (80.3 kg)   BMI 26.91 kg/m  Gen:  WD/WN, NAD Head: [place]/AT, No temporalis wasting. Ear/Nose/Throat: Hearing grossly intact, nares w/o erythema or drainage Eyes: Conjunctiva clear. Sclera non-icteric Neck: Supple.  Trachea midline Pulmonary:  Good air movement, no use of accessory muscles.  Cardiac: RRR, no JVD Vascular:  Vessel Right Left  Radial Palpable Palpable                          PT Palpable Palpable  DP Palpable Palpable   Gastrointestinal: soft, non-tender/non-distended. No guarding/reflex.  Musculoskeletal: M/S 5/5 throughout.  No deformity or atrophy. No edema. Neurologic: Sensation grossly intact in extremities.  Symmetrical.  Speech is fluent.  Psychiatric: Judgment intact, Mood & affect appropriate for pt's clinical situation. Dermatologic: No rashes or ulcers noted.  No cellulitis or open wounds.       Labs No results found for this or any previous visit (from the past 2160 hour(s)).  Radiology AAA Duplex  Result Date: 11/05/2019 ABDOMINAL AORTA STUDY Limitations: Air/bowel gas.  Comparison Study: 11/02/2018 Performing Technologist: RT (R)(VS)  Examination Guidelines: A complete evaluation includes B-mode imaging, spectral Doppler, color Doppler, and power Doppler as needed of all accessible portions of each vessel. Bilateral testing is considered an integral part of a complete examination. Limited examinations for reoccurring indications may be performed as noted.  Abdominal Aorta Findings: +-----------+-------+----------+----------+--------+--------+--------+  Location   AP (cm)Trans (cm)PSV (cm/s)WaveformThrombusComments +-----------+-------+----------+----------+--------+--------+--------+ Proximal   3.02   2.97      66                                 +-----------+-------+----------+----------+--------+--------+--------+ Mid        2.88   3.00      82                                 +-----------+-------+----------+----------+--------+--------+--------+ Distal     2.81   2.54      100                                +-----------+-------+----------+----------+--------+--------+--------+ RT CIA Prox1.8    1.7       75                                 +-----------+-------+----------+----------+--------+--------+--------+ LT CIA Prox1.6    1.7       59                                 +-----------+-------+----------+----------+--------+--------+--------+  Summary: Abdominal Aorta: There is evidence of abnormal dilatation of the proximal and mid Abdominal aorta. The largest aortic measurement is 3.0 cm. Distal aorta is ectatic but otherwise no change from the previous exam on 11/02/2018. The largest aortic diameter remains essentially  unchanged compared to prior exam. Previous diameter measurement was 3.1 cm obtained on 11/02/2018.  *See table(s) above for measurements and observations.  Electronically signed by Festus Barren MD on 11/05/2019 at 8:55:30 AM.   Final     Assessment/Plan Hypertension blood pressure control important in reducing the progression of atherosclerotic diseaseand aneurysmal disease. On appropriate oral medications.His severe, poorly controlled hypertension previously may have been a major cause of his aneurysmal degeneration.   GERD (gastroesophageal reflux disease) Continue PPI as already ordered, this medication has been reviewed and there are no changes at this time.  Avoidence of caffeine and alcohol  Moderate elevation of the head of the bed  AAA (abdominal aortic aneurysm) (HCC) Duplex  today shows a stable 3.0 cm AAA with 1.8 cm right iliac aneurysm and 1.7 cm left iliac aneurysm.  No change from previous studies. BP control important. Smoking cessation would be of benefit. Recheck in one year.    Festus Barren, MD  11/05/2019 9:58 AM    This note was created with Dragon medical transcription system.  Any errors from dictation are purely unintentional

## 2019-11-05 NOTE — Assessment & Plan Note (Signed)
Duplex today shows a stable 3.0 cm AAA with 1.8 cm right iliac aneurysm and 1.7 cm left iliac aneurysm.  No change from previous studies. BP control important. Smoking cessation would be of benefit. Recheck in one year.

## 2019-11-12 ENCOUNTER — Other Ambulatory Visit: Payer: Self-pay | Admitting: Gastroenterology

## 2019-11-12 DIAGNOSIS — B181 Chronic viral hepatitis B without delta-agent: Secondary | ICD-10-CM

## 2019-11-15 ENCOUNTER — Other Ambulatory Visit: Payer: Self-pay

## 2019-11-15 ENCOUNTER — Ambulatory Visit
Admission: RE | Admit: 2019-11-15 | Discharge: 2019-11-15 | Disposition: A | Discharge status: Home / Self Care | Payer: Medicare Other | Source: Ambulatory Visit | Attending: Gastroenterology | Admitting: Gastroenterology

## 2019-11-15 DIAGNOSIS — B181 Chronic viral hepatitis B without delta-agent: Secondary | ICD-10-CM | POA: Insufficient documentation

## 2020-07-07 ENCOUNTER — Other Ambulatory Visit: Payer: Self-pay

## 2020-07-07 ENCOUNTER — Ambulatory Visit: Payer: Medicare Other | Admitting: Urology

## 2020-07-07 ENCOUNTER — Encounter: Payer: Self-pay | Admitting: Urology

## 2020-07-07 VITALS — BP 127/81 | HR 69 | Ht 69.0 in | Wt 176.0 lb

## 2020-07-07 DIAGNOSIS — R350 Frequency of micturition: Secondary | ICD-10-CM

## 2020-07-07 DIAGNOSIS — R972 Elevated prostate specific antigen [PSA]: Secondary | ICD-10-CM

## 2020-07-07 NOTE — Progress Notes (Signed)
   07/07/20 10:12 AM   Arthur Schroeder 01-Feb-1956 [id number]  CC: Elevated PSA  HPI: I saw Arthur Schroeder in clinic today for elevated PSA.  He is a 65 year old [place] male with no family history of prostate or breast cancer who was found to have an elevated PSA of 4.6 in October 2021.  This was repeated by his PCP recently and continued to rise at 6.5.  He denies any prior urologic surgeries.  He denies any prior abdominal surgeries.  He has back hardware from a back surgery 10 years ago.  He has mild urinary symptoms of occasional urgency and frequency, but drinks primarily tea during the day.  He denies any gross hematuria.  PMH: Past Medical History:  Diagnosis Date  . AAA (abdominal aortic aneurysm) (HCC)   . Alpha+ thalassemia (HCC)   . Barrett's esophagus   . Erosive gastritis   . Gastropathy   . GERD (gastroesophageal reflux disease)   . Hepatitis    chronic hep.B of low infectivity  . Hypertension     Surgical History: Past Surgical History:  Procedure Laterality Date  . BACK SURGERY    . COLONOSCOPY WITH PROPOFOL N/A 01/31/2017   Procedure: COLONOSCOPY WITH PROPOFOL;  Surgeon: Toledo, Boykin Nearing, MD;  Location: [facility];  Service: Endoscopy;  Laterality: N/A;  . ESOPHAGOGASTRODUODENOSCOPY (EGD) WITH PROPOFOL N/A 01/31/2017   Procedure: ESOPHAGOGASTRODUODENOSCOPY (EGD) WITH PROPOFOL;  Surgeon: Toledo, Boykin Nearing, MD;  Location: [facility];  Service: Endoscopy;  Laterality: N/A;    Social History:  reports that he has been smoking. He has never used smokeless tobacco. He reports that he does not drink alcohol and does not use drugs.  Physical Exam: BP 127/81   Pulse 69   Ht 5\' 9"  (1.753 m)   Wt 176 lb (79.8 kg)   BMI 25.99 kg/m    Constitutional:  Alert and oriented, No acute distress. Cardiovascular: No clubbing, cyanosis, or edema. Respiratory: Normal respiratory effort, no increased work of breathing. GI: Abdomen is soft, nontender, nondistended, no abdominal  masses DRE: Deferred to time of biopsy  Laboratory Data: Reviewed, see HPI  Pertinent Imaging: None to review  Assessment & Plan:   65 year old male with elevated PSA of 4.6 and 6.5 in the last 8 months.  No family history of prostate or breast cancer.  We reviewed the implications of an elevated PSA and the uncertainty surrounding it. In general, a man's PSA increases with age and is produced by both normal and cancerous prostate tissue. The differential diagnosis for elevated PSA includes BPH, prostate cancer, infection, recent intercourse/ejaculation, recent urethroscopic manipulation (foley placement/cystoscopy) or trauma, and prostatitis.   Management of an elevated PSA can include observation or prostate biopsy and we discussed this in detail. Our goal is to detect clinically significant prostate cancers, and manage with either active surveillance, surgery, or radiation for localized disease. Risks of prostate biopsy include bleeding, infection (including life threatening sepsis), pain, and lower urinary symptoms. Hematuria, hematospermia, and blood in the stool are all common after biopsy and can persist up to 4 weeks.   He is not a candidate for prostate MRI with his back hardware Schedule prostate biopsy, DRE at time of biopsy  69, MD 07/07/2020  [facility] [address] [place], [place] [phone]

## 2020-07-07 NOTE — Patient Instructions (Addendum)
Prostate Cancer Screening  Prostate cancer screening is a test that is done to check for the presence of prostate cancer in men. The prostate gland is a walnut-sized gland that is located below the bladder and in front of the rectum in males. The function of the prostate is to add fluid to semen during ejaculation. Prostate cancer is the second most common type of cancer in men. Who should have prostate cancer screening?  Screening recommendations vary based on age and other risk factors. Screening is recommended if:  You are older than age 55. If you are age 55-69, talk with your health care provider about your need for screening and how often screening should be done. Because most prostate cancers are slow growing and will not cause death, screening is generally reserved in this age group for men who have a 10-15-year life expectancy.  You are younger than age 55, and you have these risk factors: ? Being a black male or a male of African descent. ? Having a father, brother, or uncle who has been diagnosed with prostate cancer. The risk is higher if your family member's cancer occurred at an early age. Screening is not recommended if:  You are younger than age 40.  You are between the ages of 40 and 54 and you have no risk factors.  You are 70 years of age or older. At this age, the risks that screening can cause are greater than the benefits that it may provide. If you are at high risk for prostate cancer, your health care provider may recommend that you have screenings more often or that you start screening at a younger age. How is screening for prostate cancer done? The recommended prostate cancer screening test is a blood test called the prostate-specific antigen (PSA) test. PSA is a protein that is made in the prostate. As you age, your prostate naturally produces more PSA. Abnormally high PSA levels may be caused by:  Prostate cancer.  An enlarged prostate that is not caused by cancer  (benign prostatic hyperplasia, BPH). This condition is very common in older men.  A prostate gland infection (prostatitis). Depending on the PSA results, you may need more tests, such as:  A physical exam to check the size of your prostate gland.  Blood and imaging tests.  A procedure to remove tissue samples from your prostate gland for testing (biopsy). What are the benefits of prostate cancer screening?  Screening can help to identify cancer at an early stage, before symptoms start and when the cancer can be treated more easily.  There is a small chance that screening may lower your risk of dying from prostate cancer. The chance is small because prostate cancer is a slow-growing cancer, and most men with prostate cancer die from a different cause. What are the risks of prostate cancer screening? The main risk of prostate cancer screening is diagnosing and treating prostate cancer that would never have caused any symptoms or problems. This is called overdiagnosisand overtreatment. PSA screening cannot tell you if your PSA is high due to cancer or a different cause. A prostate biopsy is the only procedure to diagnose prostate cancer. Even the results of a biopsy may not tell you if your cancer needs to be treated. Slow-growing prostate cancer may not need any treatment other than monitoring, so diagnosing and treating it may cause unnecessary stress or other side effects. A prostate biopsy may also cause:  Infection or fever.  A false negative. This is   a result that shows that you do not have prostate cancer when you actually do have prostate cancer. Questions to ask your health care provider  When should I start prostate cancer screening?  What is my risk for prostate cancer?  How often do I need screening?  What type of screening tests do I need?  How do I get my test results?  What do my results mean?  Do I need treatment? Where to find more information  The American Cancer  Society: www.cancer.org  American Urological Association: www.auanet.org Contact a health care provider if:  You have difficulty urinating.  You have pain when you urinate or ejaculate.  You have blood in your urine or semen.  You have pain in your back or in the area of your prostate. Summary  Prostate cancer is a common type of cancer in men. The prostate gland is located below the bladder and in front of the rectum. This gland adds fluid to semen during ejaculation.  Prostate cancer screening may identify cancer at an early stage, when the cancer can be treated more easily.  The prostate-specific antigen (PSA) test is the recommended screening test for prostate cancer.  Discuss the risks and benefits of prostate cancer screening with your health care provider. If you are age 70 or older, the risks that screening can cause are greater than the benefits that it may provide. This information is not intended to replace advice given to you by your health care provider. Make sure you discuss any questions you have with your health care provider. Document Revised: 06/14/2019 Document Reviewed: 10/04/2018 Elsevier Patient Education  2021 Elsevier Inc.   Prostate Biopsy Instructions  Stop all aspirin or blood thinners (aspirin, plavix, coumadin, warfarin, motrin, ibuprofen, advil, aleve, naproxen, naprosyn) for 7 days prior to the procedure.  If you have any questions about stopping these medications, please contact your primary care physician or cardiologist.  Having a light meal prior to the procedure is recommended.  If you are diabetic or have low blood sugar please bring a small snack or glucose tablet.  A Fleets enema is needed to be purchased over the counter at a local pharmacy and used 2 hours before you scheduled appointment.  This can be purchased over the counter at any pharmacy.  Antibiotics will be administered in the clinic at the time of the procedure unless otherwise  specified.    Please bring someone with you to the procedure to drive you home.  A follow up appointment has been scheduled for you to receive the results of the biopsy.  If you have any questions or concerns, please feel free to call the office at [phone] or send a Mychart message.    Thank you, Staff at [facility] 

## 2020-07-23 ENCOUNTER — Other Ambulatory Visit: Payer: Medicare Other | Admitting: Urology

## 2020-08-04 ENCOUNTER — Other Ambulatory Visit: Payer: Self-pay | Admitting: Gastroenterology

## 2020-08-04 DIAGNOSIS — B181 Chronic viral hepatitis B without delta-agent: Secondary | ICD-10-CM

## 2020-08-06 ENCOUNTER — Encounter: Payer: Self-pay | Admitting: Urology

## 2020-08-06 ENCOUNTER — Other Ambulatory Visit: Payer: Self-pay

## 2020-08-06 ENCOUNTER — Ambulatory Visit: Payer: Medicare Other | Admitting: Urology

## 2020-08-06 DIAGNOSIS — R972 Elevated prostate specific antigen [PSA]: Secondary | ICD-10-CM

## 2020-08-06 MED ORDER — LEVOFLOXACIN 500 MG PO TABS
500.0000 mg | ORAL_TABLET | Freq: Once | ORAL | Status: AC
Start: 2020-08-06 — End: 2020-08-06
  Administered 2020-08-06: 500 mg via ORAL

## 2020-08-06 MED ORDER — CEFTRIAXONE SODIUM 500 MG IJ SOLR
1000.0000 mg | Freq: Once | INTRAMUSCULAR | Status: AC
  Administered 2020-08-06: 1000 mg via INTRAMUSCULAR

## 2020-08-06 NOTE — Progress Notes (Addendum)
   08/06/20  Indication: Elevated PSA, 6.5  Prostate Biopsy Procedure   Informed consent was obtained, and we discussed the risks of bleeding and infection/sepsis. A time out was performed to ensure correct patient identity.  Pre-Procedure: - Last PSA Level: 6.5 -Ceftriaxone and levaquin given for antibiotic prophylaxis - Transrectal Ultrasound performed revealing a 50 gm prostate, PSA density 0.13 - No significant hypoechoic or median lobe noted -DRE 50 g, smooth and firm, no nodules or masses  Procedure: - Prostate block performed using 10 cc 1% lidocaine and biopsies taken from sextant areas, a total of 12 under ultrasound guidance.  Post-Procedure: - Patient tolerated the procedure well - He was counseled to seek immediate medical attention if experiences significant bleeding, fevers, or severe pain - Return in one week to discuss biopsy results  Assessment/ Plan: Will follow up in 1-2 weeks to discuss pathology  Legrand Rams, MD 08/06/2020

## 2020-08-06 NOTE — Patient Instructions (Signed)

## 2020-08-07 LAB — SURGICAL PATHOLOGY

## 2020-08-12 ENCOUNTER — Other Ambulatory Visit: Payer: Self-pay

## 2020-08-12 ENCOUNTER — Encounter: Payer: Self-pay | Admitting: Urology

## 2020-08-12 ENCOUNTER — Ambulatory Visit: Payer: Medicare Other | Admitting: Urology

## 2020-08-12 VITALS — BP 114/73 | HR 69 | Ht 69.0 in | Wt 176.0 lb

## 2020-08-12 DIAGNOSIS — R972 Elevated prostate specific antigen [PSA]: Secondary | ICD-10-CM | POA: Diagnosis not present

## 2020-08-12 NOTE — Progress Notes (Signed)
   08/12/2020 1:03 PM   Arthur Schroeder [birth year] [id number]  Reason for visit: Follow up prostate biopsy results  HPI: I saw Arthur Schroeder back in clinic to discuss his prostate biopsy results.  He is a 65 year old male who was found of an elevated PSA of 6.5, and underwent a prostate biopsy on 08/06/2020 that showed a 50 g prostate, PSA density of 0.13, and all cores showed only benign prostate tissue and chronic inflammation with no evidence of malignancy.  We reviewed these results today, and the 20% false-negative rate with a standard ultrasound-guided biopsy.  We discussed the need to continue to trend the PSA.  RTC 6 months with PSA prior, consider prostate MRI if significant elevation of PSA in the future   Sondra Come, MD  [facility] [address] [place], [place] [address] [phone]

## 2020-08-13 ENCOUNTER — Ambulatory Visit (HOSPITAL_COMMUNITY)
Admission: RE | Admit: 2020-08-13 | Discharge: 2020-08-13 | Disposition: A | Discharge status: Home / Self Care | Payer: Medicare Other | Source: Ambulatory Visit | Attending: Gastroenterology | Admitting: Gastroenterology

## 2020-08-13 DIAGNOSIS — B181 Chronic viral hepatitis B without delta-agent: Secondary | ICD-10-CM | POA: Insufficient documentation

## 2020-10-20 ENCOUNTER — Ambulatory Visit (INDEPENDENT_AMBULATORY_CARE_PROVIDER_SITE_OTHER): Payer: Medicare Other

## 2020-10-20 ENCOUNTER — Encounter (INDEPENDENT_AMBULATORY_CARE_PROVIDER_SITE_OTHER): Payer: Self-pay | Admitting: Vascular Surgery

## 2020-10-20 ENCOUNTER — Ambulatory Visit (INDEPENDENT_AMBULATORY_CARE_PROVIDER_SITE_OTHER): Payer: Medicare Other | Admitting: Vascular Surgery

## 2020-10-20 ENCOUNTER — Other Ambulatory Visit: Payer: Self-pay

## 2020-10-20 VITALS — BP 127/76 | HR 64 | Resp 17 | Ht 69.0 in | Wt 177.0 lb

## 2020-10-20 DIAGNOSIS — I714 Abdominal aortic aneurysm, without rupture, unspecified: Secondary | ICD-10-CM

## 2020-10-20 DIAGNOSIS — F17200 Nicotine dependence, unspecified, uncomplicated: Secondary | ICD-10-CM | POA: Diagnosis not present

## 2020-10-20 DIAGNOSIS — I10 Essential (primary) hypertension: Secondary | ICD-10-CM

## 2020-10-20 NOTE — Progress Notes (Signed)
MRN : [id number]  Lawson FiscalBandy Petillo is a 65 y.o. (07/09/1955) male who presents with chief complaint of  Chief Complaint  Patient presents with   Follow-up  .  History of Present Illness: Patient returns today in follow up of his aortoiliac aneurysms.  He is doing well.  He has no aneurysm related symptoms. Specifically, the patient denies new back or abdominal pain, or signs of peripheral embolization.  He does continue to smoke although he understands this is harmful to his vascular system.  Duplex today shows his abdominal aortic to be just less than 3 cm in diameter and his right common iliac artery to be 1.7 cm in maximal diameter with a left common iliac artery measuring 1.8 cm in maximal diameter.  These are stable from previous studies.  Current Outpatient Medications  Medication Sig Dispense Refill   carvedilol (COREG) 25 MG tablet Take 25 mg by mouth 2 (two) times daily with a meal.      folic acid (FOLVITE) 1 MG tablet Take 1 mg by mouth daily.      hydrochlorothiazide (HYDRODIURIL) 25 MG tablet Take 25 mg by mouth daily.     lisinopril (PRINIVIL,ZESTRIL) 30 MG tablet Take 30 mg by mouth 2 (two) times daily.      meloxicam (MOBIC) 7.5 MG tablet Take 7.5 mg by mouth daily.     omeprazole (PRILOSEC) 20 MG capsule Take 20 mg by mouth daily.     tenofovir (VIREAD) 300 MG tablet Take 300 mg by mouth daily.     No current facility-administered medications for this visit.    Past Medical History:  Diagnosis Date   AAA (abdominal aortic aneurysm) (HCC)    Alpha+ thalassemia (HCC)    Barrett's esophagus    Erosive gastritis    Gastropathy    GERD (gastroesophageal reflux disease)    Hepatitis    chronic hep.B of low infectivity   Hypertension     Past Surgical History:  Procedure Laterality Date   BACK SURGERY     COLONOSCOPY WITH PROPOFOL N/A 01/31/2017   Procedure: COLONOSCOPY WITH PROPOFOL;  Surgeon: Toledo, Boykin Nearingeodoro K, MD;  Location: [facility];  Service: Endoscopy;   Laterality: N/A;   ESOPHAGOGASTRODUODENOSCOPY (EGD) WITH PROPOFOL N/A 01/31/2017   Procedure: ESOPHAGOGASTRODUODENOSCOPY (EGD) WITH PROPOFOL;  Surgeon: Toledo, Boykin Nearingeodoro K, MD;  Location: [facility];  Service: Endoscopy;  Laterality: N/A;    Social History           Tobacco Use   Smoking status: Current Some Day Smoker   Smokeless tobacco: Never Used  Substance Use Topics   Alcohol use: No   Drug use: No      Family History No bleeding or clotting disorders   No Known Allergies     REVIEW OF SYSTEMS (Negative unless checked)   Constitutional: [] Weight loss  [] Fever  [] Chills Cardiac: [] Chest pain   [] Chest pressure   [] Palpitations   [] Shortness of breath when laying flat   [] Shortness of breath at rest   [] Shortness of breath with exertion. Vascular:  [] Pain in legs with walking   [] Pain in legs at rest   [] Pain in legs when laying flat   [] Claudication   [] Pain in feet when walking  [] Pain in feet at rest  [] Pain in feet when laying flat   [] History of DVT   [] Phlebitis   [] Swelling in legs   [] Varicose veins   [] Non-healing ulcers Pulmonary:   [] Uses home oxygen   [] Productive cough   []   Hemoptysis   [] Wheeze  [] COPD   [] Asthma Neurologic:  [] Dizziness  [] Blackouts   [] Seizures   [] History of stroke   [] History of TIA  [] Aphasia   [] Temporary blindness   [] Dysphagia   [] Weakness or numbness in arms   [] Weakness or numbness in legs Musculoskeletal:  [x] Arthritis   [] Joint swelling   [] Joint pain   [] Low back pain Hematologic:  [] Easy bruising  [] Easy bleeding   [] Hypercoagulable state   [x] Anemic  [] Hepatitis Gastrointestinal:  [] Blood in stool   [] Vomiting blood  [x] Gastroesophageal reflux/heartburn   [x] Abdominal pain Genitourinary:  [] Chronic kidney disease   [] Difficult urination  [] Frequent urination  [] Burning with urination   [] Hematuria Skin:  [] Rashes   [] Ulcers   [] Wounds Psychological:  [] History of anxiety   []  History of major depression.     Physical  Examination  BP 127/76 (BP Location: Right Arm)   Pulse 64   Resp 17   Ht 5\' 9"  (1.753 m)   Wt 177 lb (80.3 kg)   BMI 26.14 kg/m  Gen:  WD/WN, NAD. Appears younger than stated age. Head: [place]/AT, No temporalis wasting. Ear/Nose/Throat: Hearing grossly intact, nares w/o erythema or drainage Eyes: Conjunctiva clear. Sclera non-icteric Neck: Supple.  Trachea midline Pulmonary:  Good air movement, no use of accessory muscles.  Cardiac: RRR, no JVD Vascular:  Vessel Right Left  Radial Palpable Palpable                          PT Palpable Palpable  DP Palpable Palpable   Gastrointestinal: soft, non-tender/non-distended. No guarding/reflex.  Musculoskeletal: M/S 5/5 throughout.  No deformity or atrophy. No edema. Neurologic: Sensation grossly intact in extremities.  Symmetrical.  Speech is fluent.  Psychiatric: Judgment intact, Mood & affect appropriate for pt's clinical situation. Dermatologic: No rashes or ulcers noted.  No cellulitis or open wounds.      Labs Recent Results (from the past 2160 hour(s))  Surgical pathology     Status: None   Collection Time: 08/06/20 11:18 AM  Result Value Ref Range   SURGICAL PATHOLOGY      SURGICAL PATHOLOGY CASE: [id number] PATIENT: Surgical Pathology Report     Specimen Submitted: A. PROSTATE, LEFT BASE B. PROSTATE, LEFT MID C. PROSTATE, LEFT APEX D. PROSTATE, RIGHT BASE E. PROSTATE, RIGHT MID F. PROSTATE, RIGHT APEX G. PROSTATE, LEFT LATERAL BASE H. PROSTATE, LEFT LATERAL MID I. PROSTATE, LEFT LATERAL APEX J. PROSTATE, RIGHT LATERAL BASE K. PROSTATE, RIGHT LATERAL MID L. PROSTATE, RIGHT LATERAL APEX  Clinical History: Elevated PSA      DIAGNOSIS: Diagnostic Map   Benign  Atypical  Malignant  HGPIN     Diagnostic Summary  [A] PROSTATE, LEFT BASE:   NEGATIVE FOR MALIGNANCY.  [B] PROSTATE, LEFT MID:   NEGATIVE FOR MALIGNANCY. ATROPHY, CHRONIC INFLAMMATION.  [C] PROSTATE, LEFT APEX:   NEGATIVE  FOR MALIGNANCY. ATROPHY, CHRONIC INFLAMMATION.  [D] PROSTATE, RIGHT BASE:   NEGATIVE FOR MALIGNANCY.  [E] PROSTATE, RIGHT MID:   NEGATIVE FOR MALIGNANCY. ATROPHY.  [F] PROSTATE, RIGHT APEX:   NEGATIVE FOR MALIGNANCY. ATROPHY, C HRONIC INFLAMMATION.  [G] PROSTATE, LEFT LATERAL BASE:   NEGATIVE FOR MALIGNANCY. ATROPHY, CHRONIC INFLAMMATION.  [H] PROSTATE, LEFT LATERAL MID:   NEGATIVE FOR MALIGNANCY. ATROPHY, CHRONIC INFLAMMATION.  [I] PROSTATE, LEFT LATERAL APEX:   NEGATIVE FOR MALIGNANCY. ATROPHY, CHRONIC INFLAMMATION.  [J] PROSTATE, RIGHT LATERAL BASE:   NEGATIVE FOR MALIGNANCY. ATROPHY, CHRONIC INFLAMMATION.  [K] PROSTATE, RIGHT LATERAL MID:  NEGATIVE FOR MALIGNANCY. ATROPHY.  [L] PROSTATE, RIGHT LATERAL APEX:   NEGATIVE FOR MALIGNANCY. ATROPHY, CHRONIC INFLAMMATION.        GROSS DESCRIPTION: A. Labeled: Left base Received: Formalin Collection time: 11:18 AM on 08/06/2020 Placed into formalin time: 11:18 AM on 08/06/2020 Number of needle core biopsy(s): 1 Length: 1.5 cm Diameter: 0.1 cm Description: Needle core biopsy fragment of tan soft tissue Ink: Green Entirely submitted in 1 cassette.  B. Labeled: Left mid Received: Formalin Collection time: 11:18 AM on 08/06/2020 Placed in to formalin time: 11:18 AM on 08/06/2020 Number of needle core biopsy(s): 2 Length: Range from 0.2-1.4 cm Diameter: 0.1 cm Description: Needle core biopsy fragment of tan soft tissue Ink: Green Entirely submitted in 1 cassette.  C. Labeled: Left apex Received: Formalin Collection time: 11:18 AM on 08/06/2020 Placed into formalin time: 11:18 AM on 08/06/2020 Number of needle core biopsy(s): 1 Length: 1 cm Diameter: 0.1 cm Description: Needle core biopsy fragment of tan soft tissue Ink: Green Entirely submitted in 1 cassette.  D. Labeled: Right base Received: Formalin Collection time: 11:18 AM on 08/06/2020 Placed into formalin time: 11:18 AM on 08/06/2020 Number of needle core biopsy(s):  1 Length: 1.2 cm Diameter: 0.1 cm Description: Needle core biopsy fragment of tan soft tissue Ink: Green Entirely submitted in 1 cassette.  E. Labeled: Right mid Received: Formalin Collection time: 11:18 AM on 08/06/2020 Placed into formalin time: 11:18 AM on 08/06/2020 Number of nee dle core biopsy(s): 2 Length: Range from 0.3-1.7 cm Diameter: 0.1 cm Description: Needle core biopsy fragment of tan soft tissue Ink: Green Entirely submitted in 1 cassette.  F. Labeled: Right apex Received: Formalin Collection time: 11:18 AM on 08/06/2020 Placed into formalin time: 11:18 AM on 08/06/2020 Number of needle core biopsy(s): 1 Length: 1.4 cm Diameter: 0.1 cm Description: Needle core biopsy fragment of tan soft tissue Ink: Green Entirely submitted in 1 cassette.  G. Labeled: Left lateral base Received: Formalin Collection time: 11:18 AM on 08/06/2020 Placed into formalin time: 11:18 AM on 08/06/2020 Number of needle core biopsy(s): 1 Length: 1.4 cm Diameter: 0.1 cm Description: Needle core biopsy fragment of tan soft tissue Ink: Green Entirely submitted in 1 cassette.  H. Labeled: Left lateral mid Received: Formalin Collection time: 11:18 AM on 08/06/2020 Placed into formalin time: 11:18 AM on 08/06/2020 Number of needle core biopsy(s): 1 Length: 1.5 c m Diameter: 0.1 cm Description: Needle core biopsy fragment of tan soft tissue Ink: Green Entirely submitted in 1 cassette.  I. Labeled: Left lateral apex Received: Formalin Collection time: 11:18 AM on 08/06/2020 Placed into formalin time: 11:18 AM on 08/06/2020 Number of needle core biopsy(s): 2 Length: Range from 0.2-1.1 cm Diameter: 0.1 cm Description: Needle core biopsy fragment of tan soft tissue Ink: Green Entirely submitted in 1 cassette.  J. Labeled: Right lateral base Received: Formalin Collection time: 11:18 AM on 08/06/2020 Placed into formalin time: 11:18 AM on 08/06/2020 Number of needle core biopsy(s): 1 Length: 1.3  cm Diameter: 0.1 cm Description: Needle core biopsy fragment of tan soft tissue Ink: Green Entirely submitted in 1 cassette.  K. Labeled: Right lateral mid Received: Formalin Collection time: 11:18 AM on 08/06/2020 Placed into formalin time: 11:18 AM on 08/06/2020 Number of needle core biopsy(s): 3 Length: Range from 0.1-1.3 cm Diameter:  0.1 cm Description: Needle core biopsy fragment of tan soft tissue Ink: Green Entirely submitted in 1 cassette.  L. Labeled: Right lateral apex Received: Formalin Collection time: 11:18 AM on 08/06/2020 Placed into  formalin time: 11:18 AM on 08/06/2020 Number of needle core biopsy(s): 2 Length: Range from 0.2-1.4 cm Diameter: 0.1 cm Description: Needle core biopsy fragment of tan soft tissue Ink: Green Entirely submitted in 1 cassette.  RB 08/06/2020  Final Diagnosis performed by Georgeanna Harrison, MD.   Electronically signed 08/07/2020 3:55:20PM The electronic signature indicates that the named Attending Pathologist has evaluated the specimen Technical component performed at [facility], [address], [place], [place] [address] Lab: [phone] Dir: Jolene Schimke, MD, MMM  Professional component performed at [facility], [facility], [address] [place], [place], [place] [address] Lab: [phone] Dir: Georgiann Cocker. Oneita Kras, MD     Radiology No results found.  Assessment/Plan Hypertension blood pressure control important in reducing the progression of atherosclerotic disease and aneurysmal disease. On appropriate oral medications.  His severe, poorly controlled hypertension previously may have been a major cause of his aneurysmal degeneration.  AAA (abdominal aortic aneurysm) (HCC) Duplex today shows his abdominal aortic to be just less than 3 cm in diameter and his right common iliac artery to be 1.7 cm in maximal diameter with a left common iliac artery measuring 1.8 cm in maximal diameter.  These are stable from previous studies.  No role  for intervention.  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  Continue to follow this on an annual basis.  Smoking cessation and blood pressure control recommended.    Festus Barren, MD  10/20/2020 9:54 AM    This note was created with Dragon medical transcription system.  Any errors from dictation are purely unintentional

## 2020-10-20 NOTE — Assessment & Plan Note (Signed)
Duplex today shows his abdominal aortic to be just less than 3 cm in diameter and his right common iliac artery to be 1.7 cm in maximal diameter with a left common iliac artery measuring 1.8 cm in maximal diameter.  These are stable from previous studies.  No role for intervention.  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  Continue to follow this on an annual basis.  Smoking cessation and blood pressure control recommended.

## 2021-02-11 ENCOUNTER — Other Ambulatory Visit: Payer: Self-pay

## 2021-02-11 ENCOUNTER — Other Ambulatory Visit: Payer: Medicare Other

## 2021-02-11 DIAGNOSIS — R972 Elevated prostate specific antigen [PSA]: Secondary | ICD-10-CM

## 2021-02-12 LAB — PSA: Prostate Specific Ag, Serum: 4.6 ng/mL — ABNORMAL HIGH (ref 0.0–4.0)

## 2021-02-17 ENCOUNTER — Ambulatory Visit: Payer: Medicare Other | Admitting: Urology

## 2021-02-17 ENCOUNTER — Other Ambulatory Visit: Payer: Self-pay

## 2021-02-17 ENCOUNTER — Encounter: Payer: Self-pay | Admitting: Urology

## 2021-02-17 VITALS — BP 122/79 | HR 72 | Ht 69.0 in | Wt 180.7 lb

## 2021-02-17 DIAGNOSIS — R972 Elevated prostate specific antigen [PSA]: Secondary | ICD-10-CM | POA: Diagnosis not present

## 2021-02-17 DIAGNOSIS — N138 Other obstructive and reflux uropathy: Secondary | ICD-10-CM

## 2021-02-17 DIAGNOSIS — Z125 Encounter for screening for malignant neoplasm of prostate: Secondary | ICD-10-CM

## 2021-02-17 DIAGNOSIS — N401 Enlarged prostate with lower urinary tract symptoms: Secondary | ICD-10-CM | POA: Diagnosis not present

## 2021-02-17 MED ORDER — TAMSULOSIN HCL 0.4 MG PO CAPS
0.4000 mg | ORAL_CAPSULE | Freq: Every day | ORAL | 11 refills | Status: DC
Start: 2021-02-17 — End: 2022-02-07

## 2021-02-17 NOTE — Patient Instructions (Signed)
Prostate Cancer Screening Prostate cancer screening is testing that is done to check for the presence of prostate cancer in men. The prostate gland is a walnut-sized gland that is located below the bladder and in front of the rectum in males. The function of the prostate is to add fluid to semen during ejaculation. Prostate cancer is one of the most common types of cancer in men. Who should have prostate cancer screening? Screening recommendations vary based on age and other risk factors, as well as between the professional organizations who make the recommendations. In general, screening is recommended if: You are age 45 to 32 and have an average risk for prostate cancer. You should talk with your health care provider about your need for screening and how often screening should be done. Because most prostate cancers are slow growing and will not cause death, screening in this age group is generally reserved for men who have a 14- to 15-year life expectancy. You are younger than age 35, and you have these risk factors: Having a father, brother, or uncle who has been diagnosed with prostate cancer. The risk is higher if your family member's cancer occurred at an early age or if you have multiple family members with prostate cancer at an early age. Being a male who is [place] or is of [place] or sub-Saharan African descent. In general, screening is not recommended if: You are younger than age 76. You are between the ages of 50 and 13 and you have no risk factors. You are 60 years of age or older. At this age, the risks that screening can cause are greater than the benefits that it may provide. If you are at high risk for prostate cancer, your health care provider may recommend that you have screenings more often or that you start screening at a younger age. How is screening for prostate cancer done? The recommended prostate cancer screening test is a blood test called the prostate-specific antigen (PSA)  test. PSA is a protein that is made in the prostate. As you age, your prostate naturally produces more PSA. Abnormally high PSA levels may be caused by: Prostate cancer. An enlarged prostate that is not caused by cancer (benign prostatic hyperplasia, or BPH). This condition is very common in older men. A prostate gland infection (prostatitis) or urinary tract infection. Certain medicines such as male hormones (like testosterone) or other medicines that raise testosterone levels. A rectal exam may be done as part of prostate cancer screening to help provide information about the size of your prostate gland. When a rectal exam is performed, it should be done after the PSA level is drawn to avoid any effect on the results. Depending on the PSA results, you may need more tests, such as: A physical exam to check the size of your prostate gland, if not done as part of screening. Blood and imaging tests. A procedure to remove tissue samples from your prostate gland for testing (biopsy). This is the only way to know for certain if you have prostate cancer. What are the benefits of prostate cancer screening? Screening can help to identify cancer at an early stage, before symptoms start and when the cancer can be treated more easily. There is a small chance that screening may lower your risk of dying from prostate cancer. The chance is small because prostate cancer is a slow-growing cancer, and most men with prostate cancer die from a different cause. What are the risks of prostate cancer screening? The main  risk of prostate cancer screening is diagnosing and treating prostate cancer that would never have caused any symptoms or problems. This is called overdiagnosisand overtreatment. PSA screening cannot tell you if your PSA is high due to cancer or a different cause. A prostate biopsy is the only procedure to diagnose prostate cancer. Even the results of a biopsy may not tell you if your cancer needs to be  treated. Slow-growing prostate cancer may not need any treatment other than monitoring, so diagnosing and treating it may cause unnecessary stress or other side effects. Questions to ask your health care provider When should I start prostate cancer screening? What is my risk for prostate cancer? How often do I need screening? What type of screening tests do I need? How do I get my test results? What do my results mean? Do I need treatment? Where to find more information The American Cancer Society: www.cancer.org American Urological Association: www.auanet.org Contact a health care provider if: You have difficulty urinating. You have pain when you urinate or ejaculate. You have blood in your urine or semen. You have pain in your back or in the area of your prostate. Summary Prostate cancer is a common type of cancer in men. The prostate gland is located below the bladder and in front of the rectum. This gland adds fluid to semen during ejaculation. Prostate cancer screening may identify cancer at an early stage, when the cancer can be treated more easily and is less likely to have spread to other areas of the body. The prostate-specific antigen (PSA) test is the recommended screening test for prostate cancer, but it has associated risks. Discuss the risks and benefits of prostate cancer screening with your health care provider. If you are age 38 or older, the risks that screening can cause are greater than the benefits that it may provide. This information is not intended to replace advice given to you by your health care provider. Make sure you discuss any questions you have with your health care provider. Document Revised: 08/17/2020 Document Reviewed: 08/17/2020 Elsevier Patient Education  2022 Elsevier Inc.  Tamsulosin Capsules What is this medication? TAMSULOSIN (tam SOO loe sin) treats the symptoms of an enlarged prostate (benign prostatic hyperplasia). It works by relaxing the  muscles in the prostate and bladder, which makes it easier to urinate. It belongs to a group of medications called alpha blockers. This medicine may be used for other purposes; ask your health care provider or pharmacist if you have questions. COMMON BRAND NAME(S): Flomax What should I tell my care team before I take this medication? They need to know if you have any of the following conditions: Advanced kidney disease Advanced liver disease Low blood pressure Prostate cancer An unusual or allergic reaction to tamsulosin, sulfa drugs, other medications, foods, dyes, or preservatives Pregnant or trying to get pregnant Breast-feeding How should I use this medication? Take this medication by mouth about 30 minutes after the same meal every day. Follow the directions on the prescription label. Swallow the capsules whole with a glass of water. Do not crush, chew, or open capsules. Do not take your medication more often than directed. Do not stop taking your medication unless your care team tells you to. Talk to your care team about the use of this medication in children. Special care may be needed. Overdosage: If you think you have taken too much of this medicine contact a poison control center or emergency room at once. NOTE: This medicine is only for  you. Do not share this medicine with others. What if I miss a dose? If you miss a dose, take it as soon as you can. If it is almost time for your next dose, take only that dose. Do not take double or extra doses. If you stop taking your medication for several days or more, ask your care team what dose you should start back on. What may interact with this medication? Cimetidine Fluoxetine Ketoconazole Medications for erectile dysfunction like sildenafil, tadalafil, vardenafil Medications for high blood pressure Other alpha-blockers like alfuzosin, doxazosin, phentolamine, phenoxybenzamine, prazosin, terazosin Warfarin This list may not describe  all possible interactions. Give your health care provider a list of all the medicines, herbs, non-prescription drugs, or dietary supplements you use. Also tell them if you smoke, drink alcohol, or use illegal drugs. Some items may interact with your medicine. What should I watch for while using this medication? Visit your care team for regular check-ups. You will need lab work done before you start this medication and regularly while you are taking it. Check your blood pressure as directed. Ask your care team what your blood pressure should be, and when you should contact him or her. This medication may make you feel dizzy or lightheaded. This is more likely to happen after the first dose, after an increase in dose, or during hot weather or exercise. Drinking alcohol and taking some medications can make this worse. Do not drive, use machinery, or do anything that needs mental alertness until you know how this medication affects you. Do not sit or stand up quickly. If you begin to feel dizzy, sit down until you feel better. These effects can decrease once your body adjusts to the medication. Contact your care team right away if you have an erection that lasts longer than 4 hours or if it becomes painful. This may be a sign of a serious problem and must be treated right away to prevent permanent damage. If you are thinking of having cataract surgery, tell your eye surgeon that you have taken this medication. What side effects may I notice from receiving this medication? Side effects that you should report to your care team as soon as possible: Allergic reactions--skin rash, itching, hives, swelling of the face, lips, tongue, or throat Low blood pressure--dizziness, feeling faint or lightheaded, blurry vision Prolonged or painful erection Side effects that usually do not require medical attention (report to your care team if they continue or are bothersome): Change in sex drive or  performance Dizziness Headache Runny or stuffy nose This list may not describe all possible side effects. Call your doctor for medical advice about side effects. You may report side effects to FDA at 1-800-FDA-1088. Where should I keep my medication? Keep out of the reach of children. Store at room temperature between 15 and 30 degrees C (59 and 86 degrees F). Throw away any unused medication after the expiration date. NOTE: This sheet is a summary. It may not cover all possible information. If you have questions about this medicine, talk to your doctor, pharmacist, or health care provider.  2022 Elsevier/Gold Standard (2020-06-09 00:00:00)

## 2021-02-17 NOTE — Addendum Note (Signed)
Addended by: Frankey Shown on: 02/17/2021 09:31 AM   Modules accepted: Orders

## 2021-02-17 NOTE — Progress Notes (Signed)
° °  02/17/2021 9:28 AM   Arthur Schroeder [birth year] [id number]  Reason for visit: Follow up PSA screening, BPH  HPI: 65 year old male who underwent a prostate biopsy in June 2022 for an elevated PSA of 6.5 that showed a 50 g prostate, PSA density of 0.13, and all cores showed only benign prostate tissue and chronic inflammation with no evidence of malignancy.  Repeat PSA on 02/11/2021 dropped down to 4.6, indicating BPH is the likely etiology of his mildly elevated PSA.  We reviewed the AUA guidelines and I recommended continuing yearly PSA screening.  Could consider a prostate MRI if he had a significant rise in the PSA.  He reports some mild to moderate urinary symptoms of weak urinary stream overnight and then in the morning.  He denies any gross hematuria or incontinence.  He is interested in trying a medication for the weak stream overnight in the morning.  I recommended a trial of Flomax, and the risks and benefits were discussed.  Trial of Flomax 0.4 mg nightly RTC 1 year PVR, IPSS, PSA reflex to free prior  Sondra Come, MD  [facility] [address] [place], [place] [address] [phone]

## 2021-03-11 ENCOUNTER — Other Ambulatory Visit: Payer: Self-pay | Admitting: Gastroenterology

## 2021-03-11 DIAGNOSIS — B181 Chronic viral hepatitis B without delta-agent: Secondary | ICD-10-CM

## 2021-03-19 ENCOUNTER — Other Ambulatory Visit: Payer: Self-pay

## 2021-03-19 ENCOUNTER — Ambulatory Visit
Admission: RE | Admit: 2021-03-19 | Discharge: 2021-03-19 | Disposition: A | Discharge status: Home / Self Care | Payer: Medicare Other | Source: Ambulatory Visit | Attending: Gastroenterology | Admitting: Gastroenterology

## 2021-03-19 DIAGNOSIS — B181 Chronic viral hepatitis B without delta-agent: Secondary | ICD-10-CM | POA: Insufficient documentation

## 2021-05-14 ENCOUNTER — Encounter: Payer: Self-pay | Admitting: *Deleted

## 2021-05-14 MED ORDER — SODIUM CHLORIDE 0.9 % IV SOLN
INTRAVENOUS | Status: DC
  Administered 2021-05-17: 20 mL/h via INTRAVENOUS

## 2021-05-17 ENCOUNTER — Encounter: Payer: Self-pay | Admitting: Gastroenterology

## 2021-05-17 ENCOUNTER — Ambulatory Visit: Payer: Medicare Other | Admitting: Anesthesiology

## 2021-05-17 ENCOUNTER — Encounter
Admission: RE | Disposition: A | Discharge status: Home / Self Care | Payer: Self-pay | Source: Home / Self Care | Attending: Gastroenterology

## 2021-05-17 ENCOUNTER — Ambulatory Visit
Admission: RE | Admit: 2021-05-17 | Discharge: 2021-05-17 | Disposition: A | Discharge status: Home / Self Care | Payer: Medicare Other | Attending: Gastroenterology | Admitting: Gastroenterology

## 2021-05-17 DIAGNOSIS — K648 Other hemorrhoids: Secondary | ICD-10-CM | POA: Insufficient documentation

## 2021-05-17 DIAGNOSIS — K640 First degree hemorrhoids: Secondary | ICD-10-CM | POA: Insufficient documentation

## 2021-05-17 DIAGNOSIS — F17210 Nicotine dependence, cigarettes, uncomplicated: Secondary | ICD-10-CM | POA: Insufficient documentation

## 2021-05-17 DIAGNOSIS — K5730 Diverticulosis of large intestine without perforation or abscess without bleeding: Secondary | ICD-10-CM | POA: Diagnosis not present

## 2021-05-17 DIAGNOSIS — K219 Gastro-esophageal reflux disease without esophagitis: Secondary | ICD-10-CM | POA: Diagnosis not present

## 2021-05-17 DIAGNOSIS — I10 Essential (primary) hypertension: Secondary | ICD-10-CM | POA: Diagnosis not present

## 2021-05-17 DIAGNOSIS — B181 Chronic viral hepatitis B without delta-agent: Secondary | ICD-10-CM | POA: Diagnosis not present

## 2021-05-17 DIAGNOSIS — K625 Hemorrhage of anus and rectum: Secondary | ICD-10-CM | POA: Insufficient documentation

## 2021-05-17 HISTORY — PX: COLONOSCOPY WITH PROPOFOL: SHX5780

## 2021-05-17 HISTORY — DX: Unspecified viral hepatitis B without hepatic coma: B19.10

## 2021-05-17 HISTORY — PX: ESOPHAGOGASTRODUODENOSCOPY (EGD) WITH PROPOFOL: SHX5813

## 2021-05-17 SURGERY — COLONOSCOPY WITH PROPOFOL
Anesthesia: General

## 2021-05-17 MED ORDER — PROPOFOL 500 MG/50ML IV EMUL
INTRAVENOUS | Status: AC
  Filled 2021-05-17: qty 50

## 2021-05-17 MED ORDER — SODIUM CHLORIDE 0.9 % IV SOLN: INTRAVENOUS | Status: DC

## 2021-05-17 MED ORDER — DEXMEDETOMIDINE (PRECEDEX) IN NS 20 MCG/5ML (4 MCG/ML) IV SYRINGE
PREFILLED_SYRINGE | INTRAVENOUS | Status: DC | PRN
  Administered 2021-05-17: 12 ug via INTRAVENOUS

## 2021-05-17 MED ORDER — GLYCOPYRROLATE 0.2 MG/ML IJ SOLN
INTRAMUSCULAR | Status: AC
  Filled 2021-05-17: qty 1

## 2021-05-17 MED ORDER — LIDOCAINE HCL (CARDIAC) PF 100 MG/5ML IV SOSY
PREFILLED_SYRINGE | INTRAVENOUS | Status: DC | PRN
Start: 2021-05-17 — End: 2021-05-17
  Administered 2021-05-17: 40 mg via INTRAVENOUS

## 2021-05-17 MED ORDER — GLYCOPYRROLATE 0.2 MG/ML IJ SOLN
INTRAMUSCULAR | Status: DC | PRN
  Administered 2021-05-17: .2 mg via INTRAVENOUS

## 2021-05-17 MED ORDER — PROPOFOL 500 MG/50ML IV EMUL
INTRAVENOUS | Status: DC | PRN
  Administered 2021-05-17: 150 ug/kg/min via INTRAVENOUS

## 2021-05-17 MED ORDER — PROPOFOL 10 MG/ML IV BOLUS
INTRAVENOUS | Status: DC | PRN
  Administered 2021-05-17: 100 mg via INTRAVENOUS

## 2021-05-17 NOTE — H&P (Signed)
Outpatient short stay form Pre-procedure ?05/17/2021  ?Regis Bill, MD ? ?Primary Physician: Jaclyn Shaggy, MD ? ?Reason for visit:  GERD/Possible BE' and rectal bleeding ? ?History of present illness:   ? ?66 y/o gentleman with chronic hep b on viread here for EGD for GERD/Hx of BE's and colonoscopy for rectal bleeding. No blood thinners. No family history of GI malignancies. No abdominal surgeries. ? ? ? ?Current Facility-Administered Medications:  ?  0.9 %  sodium chloride infusion, , Intravenous, Continuous, Jordynn Perrier, Rossie Muskrat, MD, Last Rate: 20 mL/hr at 05/17/21 0904, 20 mL/hr at 05/17/21 0904 ?  0.9 %  sodium chloride infusion, , Intravenous, Continuous, Yair Dusza, Rossie Muskrat, MD ? ?Medications Prior to Admission  ?Medication Sig Dispense Refill Last Dose  ? carvedilol (COREG) 25 MG tablet Take 25 mg by mouth 2 (two) times daily with a meal.    05/16/2021  ? folic acid (FOLVITE) 1 MG tablet Take 1 mg by mouth daily.    05/16/2021  ? hydrochlorothiazide (HYDRODIURIL) 25 MG tablet Take 25 mg by mouth daily.   05/16/2021  ? lisinopril (PRINIVIL,ZESTRIL) 30 MG tablet Take 30 mg by mouth 2 (two) times daily.    05/16/2021  ? meloxicam (MOBIC) 7.5 MG tablet Take 7.5 mg by mouth daily.   05/16/2021  ? omeprazole (PRILOSEC) 20 MG capsule Take 20 mg by mouth daily.   05/16/2021  ? tamsulosin (FLOMAX) 0.4 MG CAPS capsule Take 1 capsule (0.4 mg total) by mouth daily. 30 capsule 11 05/16/2021  ? tenofovir (VIREAD) 300 MG tablet Take 300 mg by mouth daily.   05/16/2021  ? ? ? ?No Known Allergies ? ? ?Past Medical History:  ?Diagnosis Date  ? AAA (abdominal aortic aneurysm)   ? Alpha+ thalassemia (HCC)   ? Barrett's esophagus   ? Erosive gastritis   ? Gastropathy   ? GERD (gastroesophageal reflux disease)   ? HBV (hepatitis B virus) infection   ? Hepatitis   ? chronic hep.B of low infectivity  ? Hypertension   ? ? ?Review of systems:  Otherwise negative.  ? ? ?Physical Exam ? ?Gen: Alert, oriented. Appears stated age.  ?HEENT:  PERRLA. ?Lungs: No respiratory distress ?CV: RRR ?Abd: soft, benign, no masses ?Ext: No edema ? ? ? ?Planned procedures: Proceed with EGD/colonoscopy. The patient understands the nature of the planned procedure, indications, risks, alternatives and potential complications including but not limited to bleeding, infection, perforation, damage to internal organs and possible oversedation/side effects from anesthesia. The patient agrees and gives consent to proceed.  ?Please refer to procedure notes for findings, recommendations and patient disposition/instructions.  ? ? ? ?Regis Bill, MD ?[facility] ? ? ? ?  ? ?

## 2021-05-17 NOTE — Anesthesia Postprocedure Evaluation (Signed)
Anesthesia Post Note ? ?Patient: Arthur Schroeder ? ?Procedure(s) Performed: COLONOSCOPY WITH PROPOFOL ?ESOPHAGOGASTRODUODENOSCOPY (EGD) WITH PROPOFOL ? ?Patient location during evaluation: Endoscopy ?Anesthesia Type: General ?Level of consciousness: awake and alert ?Pain management: pain level controlled ?Vital Signs Assessment: post-procedure vital signs reviewed and stable ?Respiratory status: spontaneous breathing, nonlabored ventilation and respiratory function stable ?Cardiovascular status: blood pressure returned to baseline and stable ?Postop Assessment: no apparent nausea or vomiting ?Anesthetic complications: no ? ? ?No notable events documented. ? ? ?Last Vitals:  ?Vitals:  ? 05/17/21 0959 05/17/21 1010  ?BP: 103/77 119/76  ?Pulse:    ?Resp:    ?Temp:    ?SpO2:    ?  ?Last Pain:  ?Vitals:  ? 05/17/21 1010  ?TempSrc:   ?PainSc: 0-No pain  ? ? ?  ?  ?  ?  ?  ?  ? ?[name] ? ? ? ? ?

## 2021-05-17 NOTE — Op Note (Signed)
[facility] ?Gastroenterology ?Patient Name: Arthur Schroeder ?Procedure Date: 05/17/2021 8:55 AM ?MRN: [id number] ?Account #: [id number] ?Date of Birth: 18-Jun-1955 ?Admit Type: Outpatient ?Age: 66 ?Room: [facility] ENDO ROOM 3 ?Gender: Male ?Note Status: Finalized ?Instrument Name: Upper-Endoscope [id number] ?Procedure:             Upper GI endoscopy ?Indications:           Gastro-esophageal reflux disease ?Providers:             Andrey Farmer MD, MD ?Medicines:             Monitored Anesthesia Care ?Complications:         No immediate complications. ?Procedure:             Pre-Anesthesia Assessment: ?                       - Prior to the procedure, a History and Physical was  ?                       performed, and patient medications and allergies were  ?                       reviewed. The patient is competent. The risks and  ?                       benefits of the procedure and the sedation options and  ?                       risks were discussed with the patient. All questions  ?                       were answered and informed consent was obtained.  ?                       Patient identification and proposed procedure were  ?                       verified by the physician, the nurse, the  ?                       anesthesiologist, the anesthetist and the technician  ?                       in the endoscopy suite. Mental Status Examination:  ?                       alert and oriented. Airway Examination: normal  ?                       oropharyngeal airway and neck mobility. Respiratory  ?                       Examination: clear to auscultation. CV Examination:  ?                       normal. Prophylactic Antibiotics: The patient does not  ?                       require prophylactic antibiotics. Prior  ?  Anticoagulants: The patient has taken no previous  ?                       anticoagulant or antiplatelet agents. ASA Grade  ?                       Assessment: II - A patient with  mild systemic disease.  ?                       After reviewing the risks and benefits, the patient  ?                       was deemed in satisfactory condition to undergo the  ?                       procedure. The anesthesia plan was to use monitored  ?                       anesthesia care (MAC). Immediately prior to  ?                       administration of medications, the patient was  ?                       re-assessed for adequacy to receive sedatives. The  ?                       heart rate, respiratory rate, oxygen saturations,  ?                       blood pressure, adequacy of pulmonary ventilation, and  ?                       response to care were monitored throughout the  ?                       procedure. The physical status of the patient was  ?                       re-assessed after the procedure. ?                       After obtaining informed consent, the endoscope was  ?                       passed under direct vision. Throughout the procedure,  ?                       the patient's blood pressure, pulse, and oxygen  ?                       saturations were monitored continuously. The Endoscope  ?                       was introduced through the mouth, and advanced to the  ?                       second part of duodenum. The upper GI endoscopy was  ?  accomplished without difficulty. The patient tolerated  ?                       the procedure well. ?Findings: ?     The esophagus and gastroesophageal junction were examined with white  ?     light and narrow band imaging (NBI). There was no visual evidence of  ?     Barrett's esophagus. ?     The exam of the esophagus was otherwise normal. ?     The entire examined stomach was normal. ?     The examined duodenum was normal. ?Impression:            - There is no endoscopic evidence of Barrett's  ?                       esophagus. ?                       - Normal stomach. ?                       - Normal examined  duodenum. ?                       - No specimens collected. ?Recommendation:        - Perform a colonoscopy today. ?Procedure Code(s):     --- Professional --- ?                       [phone], Esophagogastroduodenoscopy, flexible,  ?                       transoral; diagnostic, including collection of  ?                       specimen(s) by brushing or washing, when performed  ?                       (separate procedure) ?Diagnosis Code(s):     --- Professional --- ?                       K21.9, Gastro-esophageal reflux disease without  ?                       esophagitis ?CPT copyright 2019 American Medical Association. All rights reserved. ?The codes documented in this report are preliminary and upon coder review may  ?be revised to meet current compliance requirements. ?Andrey Farmer MD, MD ?05/17/2021 9:41:21 AM ?Number of Addenda: 0 ?Note Initiated On: 05/17/2021 8:55 AM ?Estimated Blood Loss:  Estimated blood loss: none. ?     [facility] ?

## 2021-05-17 NOTE — Anesthesia Procedure Notes (Signed)
Date/Time: 05/17/2021 9:13 AM ?Performed by: Stormy Fabian, CRNA ?Pre-anesthesia Checklist: Patient identified, Emergency Drugs available, Suction available and Patient being monitored ?Patient Re-evaluated:Patient Re-evaluated prior to induction ?Oxygen Delivery Method: Nasal cannula ?Induction Type: IV induction ?Dental Injury: Teeth and Oropharynx as per pre-operative assessment  ?Comments: Nasal cannula with etCO2 monitoring ? ? ? ? ?

## 2021-05-17 NOTE — Interval H&P Note (Signed)
History and Physical Interval Note: ? ?05/17/2021 ?9:08 AM ? ?Arthur Schroeder  has presented today for surgery, with the diagnosis of RECTAL BLEEDING,BARRETT'S ESOPHAGUS.  The various methods of treatment have been discussed with the patient and family. After consideration of risks, benefits and other options for treatment, the patient has consented to  Procedure(s): ?COLONOSCOPY WITH PROPOFOL (N/A) ?ESOPHAGOGASTRODUODENOSCOPY (EGD) WITH PROPOFOL (N/A) as a surgical intervention.  The patient's history has been reviewed, patient examined, no change in status, stable for surgery.  I have reviewed the patient's chart and labs.  Questions were answered to the patient's satisfaction.   ? ? ?[name] ? ?Ok to proceed with EGD/Colonoscopy ?

## 2021-05-17 NOTE — Transfer of Care (Signed)
Immediate Anesthesia Transfer of Care Note ? ?Patient: Khylin Gutridge ? ?Procedure(s) Performed: Procedure(s): ?COLONOSCOPY WITH PROPOFOL (N/A) ?ESOPHAGOGASTRODUODENOSCOPY (EGD) WITH PROPOFOL (N/A) ? ?Patient Location: PACU and Endoscopy Unit ? ?Anesthesia Type:General ? ?Level of Consciousness: sedated ? ?Airway & Oxygen Therapy: Patient Spontanous Breathing and Patient connected to nasal cannula oxygen ? ?Post-op Assessment: Report given to RN and Post -op Vital signs reviewed and stable ? ?Post vital signs: Reviewed and stable ? ?Last Vitals:  ?Vitals:  ? 05/17/21 0856  ?BP: (!) 155/94  ?Pulse: 61  ?Resp: 20  ?Temp: (!) 35.9 ?C  ?SpO2: 100%  ? ? ?Complications: No apparent anesthesia complications ?

## 2021-05-17 NOTE — Anesthesia Preprocedure Evaluation (Addendum)
Anesthesia Evaluation  ?Patient identified by MRN, date of birth, ID band ?Patient awake ? ? ? ?Reviewed: ?Allergy & Precautions, NPO status , Patient's Chart, lab work & pertinent test results ? ?Airway ?Mallampati: III ? ?TM Distance: >3 FB ?Neck ROM: full ? ? ? Dental ?no notable dental hx. ? ?  ?Pulmonary ?Current Smoker and Patient abstained from smoking.,  ?  ?Pulmonary exam normal ? ? ? ? ? ? ? Cardiovascular ?Exercise Tolerance: Good ?hypertension, Pt. on medications and Pt. on home beta blockers ?Normal cardiovascular exam ? ?aortoiliac aneurysms ?  ?Neuro/Psych ?negative neurological ROS ? negative psych ROS  ? GI/Hepatic ?GERD  Medicated,(+) Hepatitis - (chronic), BErosive gastritis ?Barrett's esophagus ?Rectal Bleeding ?  ?Endo/Other  ?negative endocrine ROS ? Renal/GU ?negative Renal ROS  ?negative genitourinary ?  ?Musculoskeletal ? ? Abdominal ?Normal abdominal exam  (+)   ?Peds ? Hematology ? ?(+) Blood dyscrasia, anemia , Alpha thalassemia   ?Anesthesia Other Findings ?Past Medical History: ?No date: AAA (abdominal aortic aneurysm) ?No date: Alpha+ thalassemia (HCC) ?No date: Barrett's esophagus ?No date: Erosive gastritis ?No date: Gastropathy ?No date: GERD (gastroesophageal reflux disease) ?No date: HBV (hepatitis B virus) infection ?No date: Hepatitis ?    Comment:  chronic hep.B of low infectivity ?No date: Hypertension ? ?Past Surgical History: ?No date: BACK SURGERY ?01/31/2017: COLONOSCOPY WITH PROPOFOL; N/A ?    Comment:  Procedure: COLONOSCOPY WITH PROPOFOL;  Surgeon: Toledo,  ?             Boykin Nearing, MD;  Location: [facility] ENDOSCOPY;  Service:  ?             Endoscopy;  Laterality: N/A; ?01/31/2017: ESOPHAGOGASTRODUODENOSCOPY (EGD) WITH PROPOFOL; N/A ?    Comment:  Procedure: ESOPHAGOGASTRODUODENOSCOPY (EGD) WITH  ?             PROPOFOL;  Surgeon: Norma Fredrickson, Boykin Nearing, MD;  Location:  ?             [facility] ENDOSCOPY;  Service: Endoscopy;  Laterality: N/A; ? ? ? ?  Reproductive/Obstetrics ?negative OB ROS ? ?  ? ? ? ? ? ? ? ? ? ? ? ? ? ?  ?  ? ? ? ? ? ? ?Anesthesia Physical ?Anesthesia Plan ? ?ASA: 3 ? ?Anesthesia Plan: General  ? ?Post-op Pain Management:   ? ?Induction:  ? ?PONV Risk Score and Plan: Propofol infusion and TIVA ? ?Airway Management Planned: Natural Airway ? ?Additional Equipment:  ? ?Intra-op Plan:  ? ?Post-operative Plan:  ? ?Informed Consent: I have reviewed the patients History and Physical, chart, labs and discussed the procedure including the risks, benefits and alternatives for the proposed anesthesia with the patient or authorized representative who has indicated his/her understanding and acceptance.  ? ? ? ?Dental advisory given ? ?Plan Discussed with: Anesthesiologist, CRNA and Surgeon ? ?Anesthesia Plan Comments:   ? ? ? ? ? ?Anesthesia Quick Evaluation ? ?

## 2021-05-17 NOTE — Op Note (Signed)
[facility] ?Gastroenterology ?Patient Name: Arthur Schroeder ?Procedure Date: 05/17/2021 8:55 AM ?MRN: [id number] ?Account #: [id number] ?Date of Birth: 03-14-1955 ?Admit Type: Outpatient ?Age: 66 ?Room: [facility] ENDO ROOM 3 ?Gender: Male ?Note Status: Finalized ?Instrument Name: Colonoscope [id number] ?Procedure:             Colonoscopy ?Indications:           Rectal bleeding ?Providers:             Andrey Farmer MD, MD ?Medicines:             Monitored Anesthesia Care ?Complications:         No immediate complications. ?Procedure:             Pre-Anesthesia Assessment: ?                       - Prior to the procedure, a History and Physical was  ?                       performed, and patient medications and allergies were  ?                       reviewed. The patient is competent. The risks and  ?                       benefits of the procedure and the sedation options and  ?                       risks were discussed with the patient. All questions  ?                       were answered and informed consent was obtained.  ?                       Patient identification and proposed procedure were  ?                       verified by the physician, the nurse, the  ?                       anesthesiologist, the anesthetist and the technician  ?                       in the endoscopy suite. Mental Status Examination:  ?                       alert and oriented. Airway Examination: normal  ?                       oropharyngeal airway and neck mobility. Respiratory  ?                       Examination: clear to auscultation. CV Examination:  ?                       normal. Prophylactic Antibiotics: The patient does not  ?                       require prophylactic antibiotics. Prior  ?  Anticoagulants: The patient has taken no previous  ?                       anticoagulant or antiplatelet agents. ASA Grade  ?                       Assessment: II - A patient with mild systemic disease.  ?                        After reviewing the risks and benefits, the patient  ?                       was deemed in satisfactory condition to undergo the  ?                       procedure. The anesthesia plan was to use monitored  ?                       anesthesia care (MAC). Immediately prior to  ?                       administration of medications, the patient was  ?                       re-assessed for adequacy to receive sedatives. The  ?                       heart rate, respiratory rate, oxygen saturations,  ?                       blood pressure, adequacy of pulmonary ventilation, and  ?                       response to care were monitored throughout the  ?                       procedure. The physical status of the patient was  ?                       re-assessed after the procedure. ?                       After obtaining informed consent, the colonoscope was  ?                       passed under direct vision. Throughout the procedure,  ?                       the patient's blood pressure, pulse, and oxygen  ?                       saturations were monitored continuously. The  ?                       Colonoscope was introduced through the anus and  ?                       advanced to the the cecum, identified by appendiceal  ?  orifice and ileocecal valve. The colonoscopy was  ?                       performed without difficulty. The patient tolerated  ?                       the procedure well. The quality of the bowel  ?                       preparation was adequate to identify polyps. ?Findings: ?     The perianal and digital rectal examinations were normal. ?     Many small and large-mouthed diverticula were found in the ascending  ?     colon and cecum. ?     Multiple small-mouthed diverticula were found in the sigmoid colon,  ?     descending colon, splenic flexure and transverse colon. ?     Internal hemorrhoids were found during retroflexion. The hemorrhoids  ?     were Grade  I (internal hemorrhoids that do not prolapse). ?     The exam was otherwise without abnormality on direct and retroflexion  ?     views. ?Impression:            - Diverticulosis in the ascending colon and in the  ?                       cecum. ?                       - Diverticulosis in the sigmoid colon, in the  ?                       descending colon, at the splenic flexure and in the  ?                       transverse colon. ?                       - Internal hemorrhoids. ?                       - The examination was otherwise normal on direct and  ?                       retroflexion views. ?                       - No specimens collected. ?Recommendation:        - Discharge patient to home. ?                       - Resume previous diet. ?                       - Continue present medications. ?                       - Repeat colonoscopy in 10 years for screening  ?                       purposes. ?                       -  Return to referring physician as previously  ?                       scheduled. ?Procedure Code(s):     --- Professional --- ?                       [phone], Colonoscopy, flexible; diagnostic, including  ?                       collection of specimen(s) by brushing or washing, when  ?                       performed (separate procedure) ?Diagnosis Code(s):     --- Professional --- ?                       K64.0, First degree hemorrhoids ?                       K62.5, Hemorrhage of anus and rectum ?                       K57.30, Diverticulosis of large intestine without  ?                       perforation or abscess without bleeding ?CPT copyright 2019 American Medical Association. All rights reserved. ?The codes documented in this report are preliminary and upon coder review may  ?be revised to meet current compliance requirements. ?Andrey Farmer MD, MD ?05/17/2021 9:44:45 AM ?Number of Addenda: 0 ?Note Initiated On: 05/17/2021 8:55 AM ?Scope Withdrawal Time: 0 hours 8 minutes 13 seconds   ?Total Procedure Duration: 0 hours 11 minutes 39 seconds  ?Estimated Blood Loss:  Estimated blood loss: none. ?     [facility] ?

## 2021-10-19 ENCOUNTER — Encounter (INDEPENDENT_AMBULATORY_CARE_PROVIDER_SITE_OTHER): Payer: Self-pay | Admitting: Vascular Surgery

## 2021-10-19 ENCOUNTER — Ambulatory Visit (INDEPENDENT_AMBULATORY_CARE_PROVIDER_SITE_OTHER): Payer: Medicare Other

## 2021-10-19 ENCOUNTER — Ambulatory Visit (INDEPENDENT_AMBULATORY_CARE_PROVIDER_SITE_OTHER): Payer: Medicare Other | Admitting: Vascular Surgery

## 2021-10-19 VITALS — BP 109/70 | HR 64 | Resp 18 | Ht 69.0 in | Wt 173.0 lb

## 2021-10-19 DIAGNOSIS — I10 Essential (primary) hypertension: Secondary | ICD-10-CM

## 2021-10-19 DIAGNOSIS — I714 Abdominal aortic aneurysm, without rupture, unspecified: Secondary | ICD-10-CM | POA: Diagnosis not present

## 2021-10-19 DIAGNOSIS — I7143 Infrarenal abdominal aortic aneurysm, without rupture: Secondary | ICD-10-CM | POA: Diagnosis not present

## 2021-10-19 NOTE — Progress Notes (Signed)
MRN : [id number]  Arthur Schroeder is a 66 y.o. (08-24-1955) male who presents with chief complaint of No chief complaint on file. Marland Kitchen  History of Present Illness: Patient returns today in follow up of his abdominal aortic aneurysm and iliac artery aneurysms.  He is doing well today.  He denies any aneurysm related symptoms. Specifically, the patient denies new back or abdominal pain, or signs of peripheral embolization.  His blood pressure is under much better control currently than it was initially.  He does still smoke and he knows this is a risk factor for aneurysmal growth.  His aortic duplex today reveals a maximal diameter of his abdominal aortic aneurysm to be 3.0 cm.  The right common iliac artery measures 1.6 cm and the left common iliac artery measures 1.5 cm.  Current Outpatient Medications  Medication Sig Dispense Refill   carvedilol (COREG) 25 MG tablet Take 25 mg by mouth 2 (two) times daily with a meal.      folic acid (FOLVITE) 1 MG tablet Take 1 mg by mouth daily.      hydrochlorothiazide (HYDRODIURIL) 25 MG tablet Take 25 mg by mouth daily.     lisinopril (PRINIVIL,ZESTRIL) 30 MG tablet Take 30 mg by mouth 2 (two) times daily.      meloxicam (MOBIC) 7.5 MG tablet Take 7.5 mg by mouth daily.     tenofovir (VIREAD) 300 MG tablet Take 300 mg by mouth daily.     omeprazole (PRILOSEC) 20 MG capsule Take 20 mg by mouth daily. (Patient not taking: Reported on 10/19/2021)     tamsulosin (FLOMAX) 0.4 MG CAPS capsule Take 1 capsule (0.4 mg total) by mouth daily. (Patient not taking: Reported on 10/19/2021) 30 capsule 11   No current facility-administered medications for this visit.    Past Medical History:  Diagnosis Date   AAA (abdominal aortic aneurysm) (HCC)    Alpha+ thalassemia (HCC)    Barrett's esophagus    Erosive gastritis    Gastropathy    GERD (gastroesophageal reflux disease)    HBV (hepatitis B virus) infection    Hepatitis    chronic hep.B of low infectivity    Hypertension     Past Surgical History:  Procedure Laterality Date   BACK SURGERY     COLONOSCOPY WITH PROPOFOL N/A 01/31/2017   Procedure: COLONOSCOPY WITH PROPOFOL;  Surgeon: Toledo, Boykin Nearing, MD;  Location: [facility] ENDOSCOPY;  Service: Endoscopy;  Laterality: N/A;   COLONOSCOPY WITH PROPOFOL N/A 05/17/2021   Procedure: COLONOSCOPY WITH PROPOFOL;  Surgeon: Regis Bill, MD;  Location: [facility] ENDOSCOPY;  Service: Endoscopy;  Laterality: N/A;   ESOPHAGOGASTRODUODENOSCOPY (EGD) WITH PROPOFOL N/A 01/31/2017   Procedure: ESOPHAGOGASTRODUODENOSCOPY (EGD) WITH PROPOFOL;  Surgeon: Toledo, Boykin Nearing, MD;  Location: [facility] ENDOSCOPY;  Service: Endoscopy;  Laterality: N/A;   ESOPHAGOGASTRODUODENOSCOPY (EGD) WITH PROPOFOL N/A 05/17/2021   Procedure: ESOPHAGOGASTRODUODENOSCOPY (EGD) WITH PROPOFOL;  Surgeon: Regis Bill, MD;  Location: [facility] ENDOSCOPY;  Service: Endoscopy;  Laterality: N/A;    Social History           Tobacco Use   Smoking status: Current Some Day Smoker   Smokeless tobacco: Never Used  Substance Use Topics   Alcohol use: No   Drug use: No      Family History No bleeding or clotting disorders   No Known Allergies     REVIEW OF SYSTEMS (Negative unless checked)   Constitutional: [] Weight loss  [] Fever  [] Chills Cardiac: [] Chest pain   [] Chest pressure   []   Palpitations   [] Shortness of breath when laying flat   [] Shortness of breath at rest   [] Shortness of breath with exertion. Vascular:  [] Pain in legs with walking   [] Pain in legs at rest   [] Pain in legs when laying flat   [] Claudication   [] Pain in feet when walking  [] Pain in feet at rest  [] Pain in feet when laying flat   [] History of DVT   [] Phlebitis   [] Swelling in legs   [] Varicose veins   [] Non-healing ulcers Pulmonary:   [] Uses home oxygen   [] Productive cough   [] Hemoptysis   [] Wheeze  [] COPD   [] Asthma Neurologic:  [] Dizziness  [] Blackouts   [] Seizures   [] History of stroke   [] History of TIA  [] Aphasia    [] Temporary blindness   [] Dysphagia   [] Weakness or numbness in arms   [] Weakness or numbness in legs Musculoskeletal:  [x] Arthritis   [] Joint swelling   [] Joint pain   [] Low back pain Hematologic:  [] Easy bruising  [] Easy bleeding   [] Hypercoagulable state   [x] Anemic  [] Hepatitis Gastrointestinal:  [] Blood in stool   [] Vomiting blood  [x] Gastroesophageal reflux/heartburn   [x] Abdominal pain Genitourinary:  [] Chronic kidney disease   [] Difficult urination  [] Frequent urination  [] Burning with urination   [] Hematuria Skin:  [] Rashes   [] Ulcers   [] Wounds Psychological:  [] History of anxiety   []  History of major depression.     Physical Examination  BP 109/70 (BP Location: Left Arm)   Pulse 64   Resp 18   Ht 5\' 9"  (1.753 m)   Wt 173 lb (78.5 kg)   BMI 25.55 kg/m  Gen:  WD/WN, NAD Head: [place]/AT, No temporalis wasting. Ear/Nose/Throat: Hearing grossly intact, nares w/o erythema or drainage Eyes: Conjunctiva clear. Sclera non-icteric Neck: Supple.  Trachea midline Pulmonary:  Good air movement, no use of accessory muscles.  Cardiac: RRR, no JVD Vascular:  Vessel Right Left  Radial Palpable Palpable                                   Gastrointestinal: soft, non-tender/non-distended. No guarding/reflex.  Musculoskeletal: M/S 5/5 throughout.  No deformity or atrophy.  No significant lower extremity edema. Neurologic: Sensation grossly intact in extremities.  Symmetrical.  Speech is fluent.  Psychiatric: Judgment intact, Mood & affect appropriate for pt's clinical situation. Dermatologic: No rashes or ulcers noted.  No cellulitis or open wounds.      Labs No results found for this or any previous visit (from the past 2160 hour(s)).  Radiology No results found.  Assessment/Plan Hypertension blood pressure control important in reducing the progression of atherosclerotic disease and aneurysmal disease. On appropriate oral medications.  His severe, poorly controlled  hypertension previously may have been a major cause of his aneurysmal degeneration.  AAA (abdominal aortic aneurysm) (HCC) His aortic duplex today reveals a maximal diameter of his abdominal aortic aneurysm to be 3.0 cm.  The right common iliac artery measures 1.6 cm and the left common iliac artery measures 1.5 cm.  Smoking cessation again discussed that would be of great benefit.  Blood pressure control has significantly improved.  At this point, continue to follow on an annual basis with duplex unless problems develop in the interim.    , MD  10/19/2021 9:54 AM    This note was created with Dragon medical transcription system.  Any errors from dictation are purely unintentional

## 2021-10-19 NOTE — Assessment & Plan Note (Signed)
His aortic duplex today reveals a maximal diameter of his abdominal aortic aneurysm to be 3.0 cm.  The right common iliac artery measures 1.6 cm and the left common iliac artery measures 1.5 cm.  Smoking cessation again discussed that would be of great benefit.  Blood pressure control has significantly improved.  At this point, continue to follow on an annual basis with duplex unless problems develop in the interim.

## 2022-01-03 ENCOUNTER — Encounter (INDEPENDENT_AMBULATORY_CARE_PROVIDER_SITE_OTHER): Payer: Self-pay

## 2022-02-07 ENCOUNTER — Other Ambulatory Visit: Payer: Self-pay | Admitting: Urology

## 2022-02-11 ENCOUNTER — Other Ambulatory Visit: Payer: Medicare Other

## 2022-02-11 DIAGNOSIS — R972 Elevated prostate specific antigen [PSA]: Secondary | ICD-10-CM

## 2022-02-11 DIAGNOSIS — Z125 Encounter for screening for malignant neoplasm of prostate: Secondary | ICD-10-CM

## 2022-02-11 DIAGNOSIS — N401 Enlarged prostate with lower urinary tract symptoms: Secondary | ICD-10-CM

## 2022-02-12 LAB — PSA: Prostate Specific Ag, Serum: 4.8 ng/mL — ABNORMAL HIGH (ref 0.0–4.0)

## 2022-02-17 ENCOUNTER — Encounter: Payer: Self-pay | Admitting: Urology

## 2022-02-17 ENCOUNTER — Ambulatory Visit: Payer: Medicare Other | Admitting: Urology

## 2022-02-17 VITALS — BP 120/80 | HR 73 | Ht 69.0 in | Wt 176.0 lb

## 2022-02-17 DIAGNOSIS — N138 Other obstructive and reflux uropathy: Secondary | ICD-10-CM | POA: Diagnosis not present

## 2022-02-17 DIAGNOSIS — N401 Enlarged prostate with lower urinary tract symptoms: Secondary | ICD-10-CM | POA: Diagnosis not present

## 2022-02-17 DIAGNOSIS — Z125 Encounter for screening for malignant neoplasm of prostate: Secondary | ICD-10-CM

## 2022-02-17 LAB — BLADDER SCAN AMB NON-IMAGING

## 2022-02-17 MED ORDER — TAMSULOSIN HCL 0.4 MG PO CAPS: 0.4000 mg | ORAL_CAPSULE | Freq: Every day | ORAL | 3 refills | Status: DC

## 2022-02-17 NOTE — Progress Notes (Signed)
   02/17/2022 9:47 AM   Royce Olena Leatherwood Sep 08, 1955 [id number]  Reason for visit: Follow up PSA screening, BPH/LUTS  HPI: 66 year old male who underwent a prostate biopsy in June 2022 for an elevated PSA of 6.5 that showed a 50 g prostate, PSA density of 0.13, and all cores showed only benign prostate tissue and chronic inflammation with no evidence of malignancy.  Repeat PSA on 02/11/2021 dropped down to 4.6, indicating BPH is the likely etiology of his mildly elevated PSA.  We reviewed the AUA guidelines and I recommended continuing yearly PSA screening.    PSA this year is stable at 4.8 from 4.6 last year, and down from initial PSA of 6.5 when he had a negative biopsy.  At our last visit he reported some weak stream in the morning and overnight and we started Flomax.  He does report some improvement in his urinary symptoms on that medication, and denies any significant urinary complaints today.  IPSS score today is 7, PVR is normal at 64ml.  Flomax 0.4 mg refilled RTC 1 year PVR, PSA reflex to free prior  Sondra Come, MD  [facility] [address], [address] [place], [place] [address] [phone]

## 2022-05-18 IMAGING — US US ABDOMEN COMPLETE
1 series · 13 of 25 positions shown · non-contrast
Comparison: Ultrasound 10/17/2018 and earlier.

CLINICAL DATA: 63-year-old male with chronic hepatitis-B.

EXAM:
ABDOMEN ULTRASOUND COMPLETE

[Series 1: us abdomen complete · 0.23mm/px · 13 of 90 slices shown]
[im 1/90]
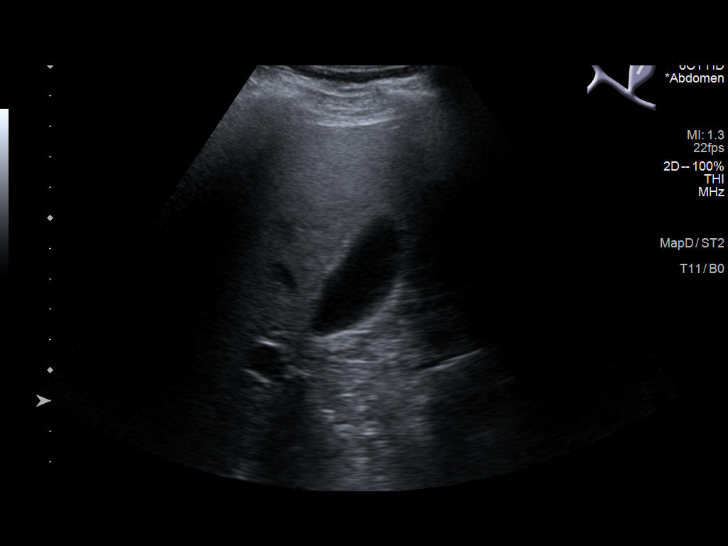
[im 8/90]
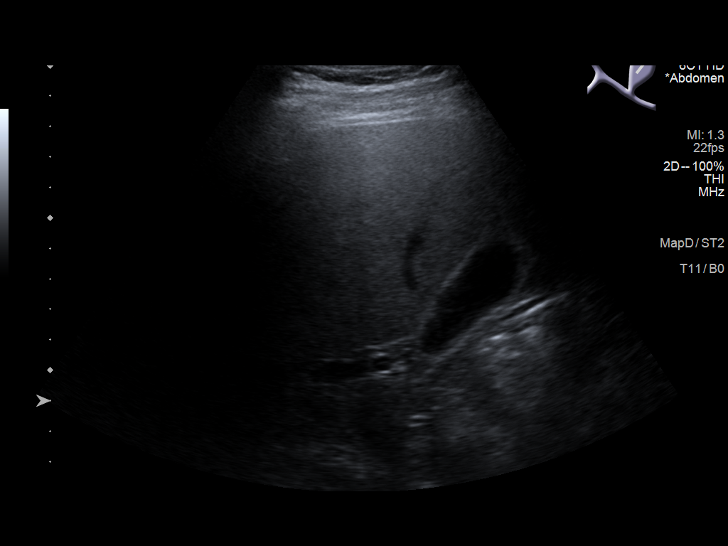
[im 15/90]
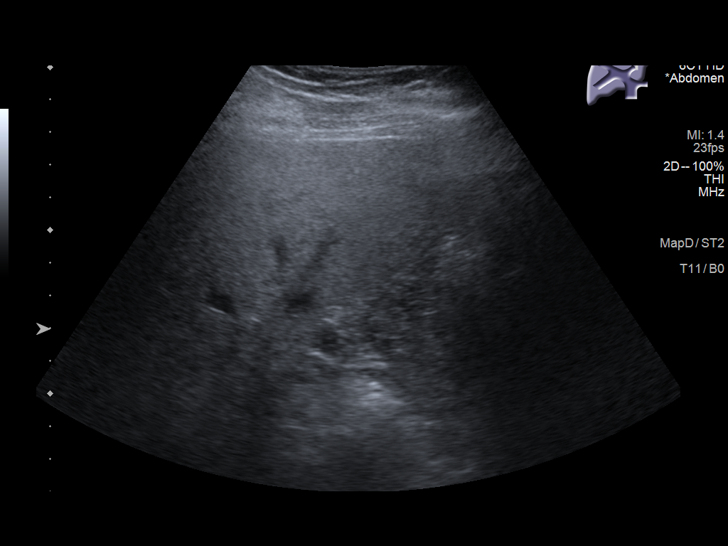
[im 23/90]
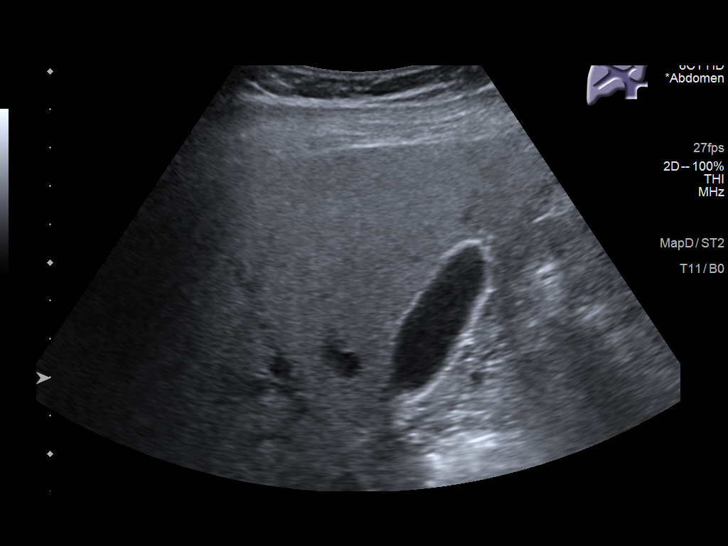
[im 30/90]
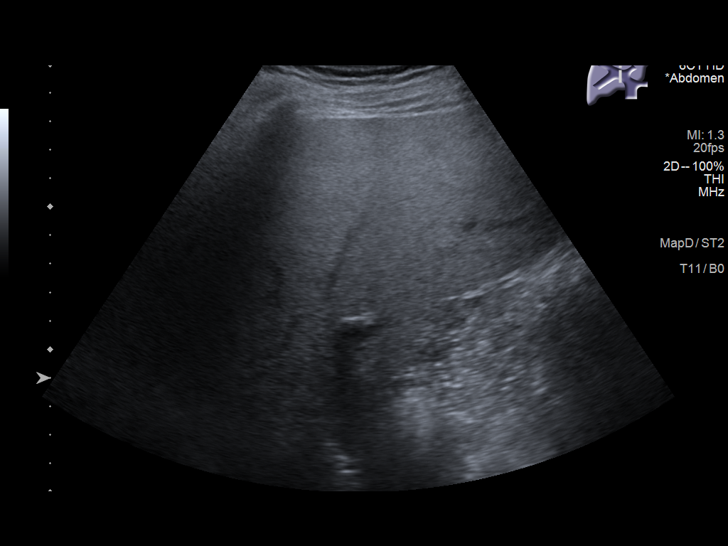
[im 38/90]
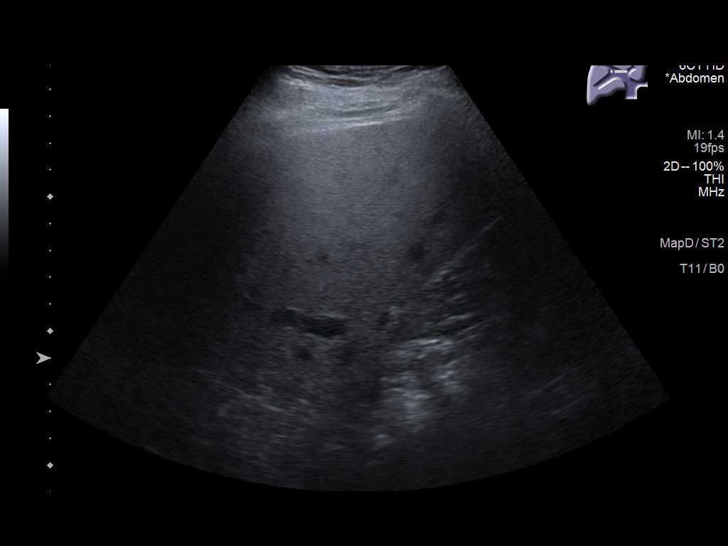
[im 45/90]
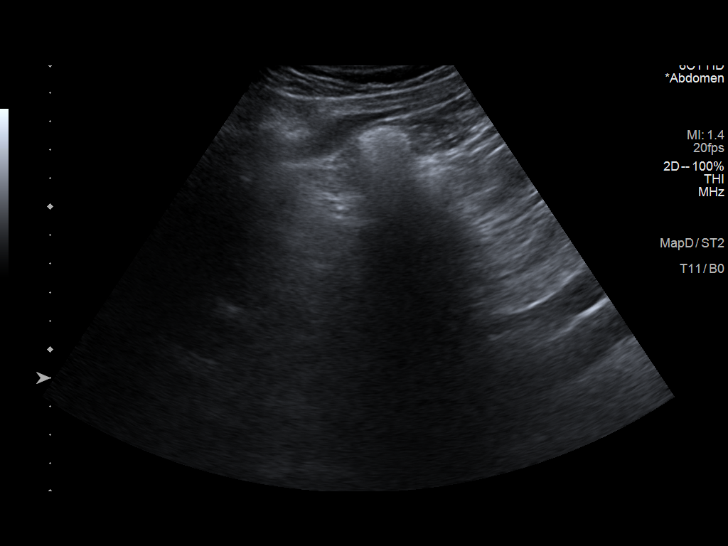
[im 52/90]
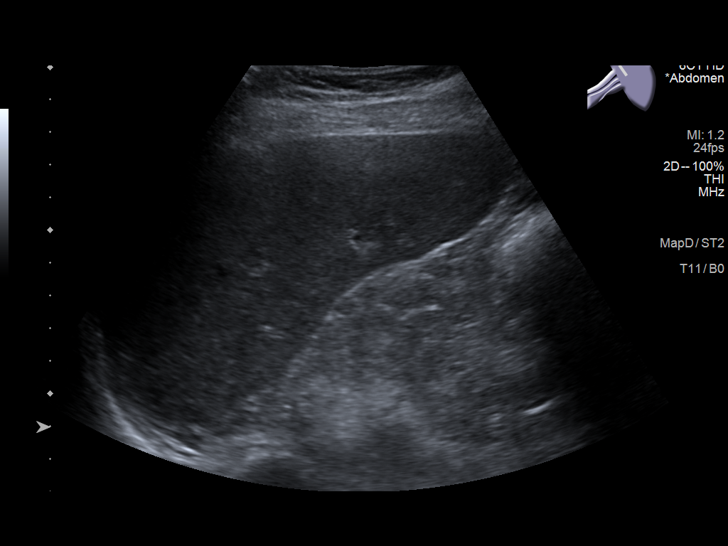
[im 60/90]
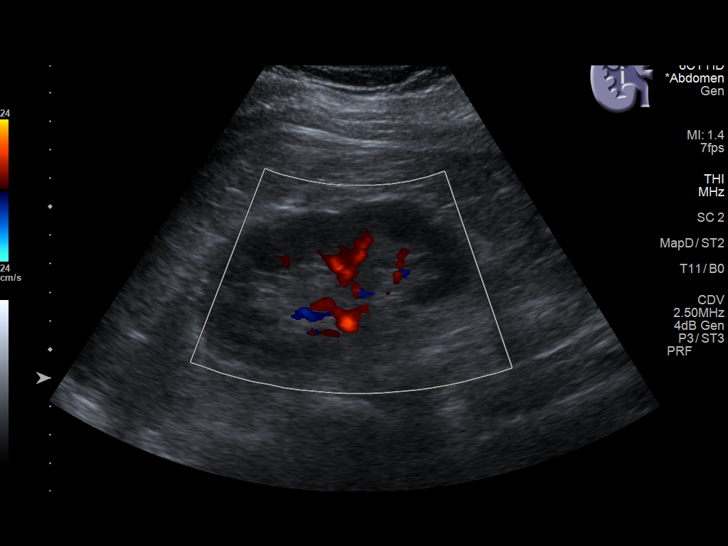
[im 67/90]
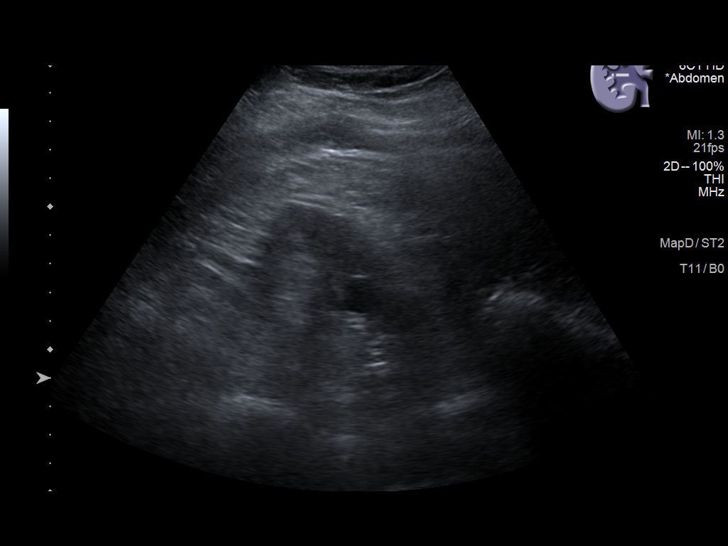
[im 75/90]
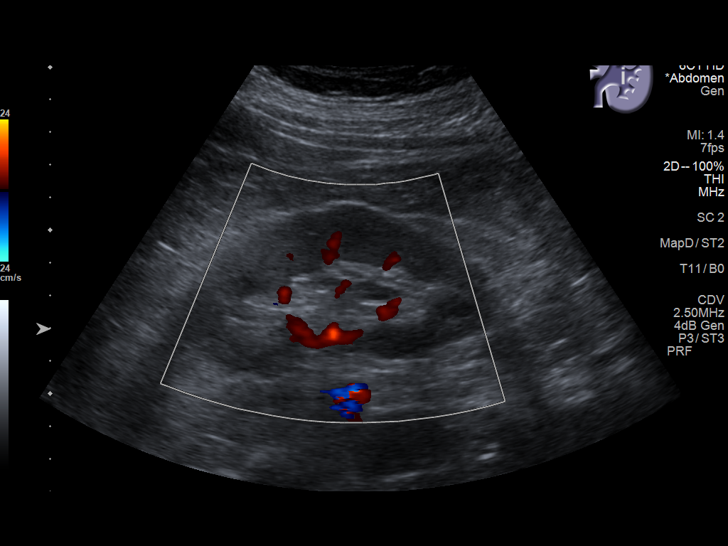
[im 82/90]
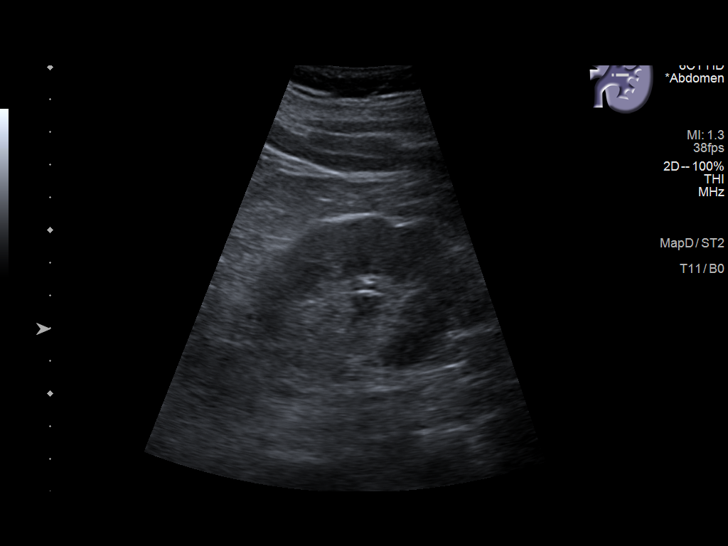
[im 90/90]
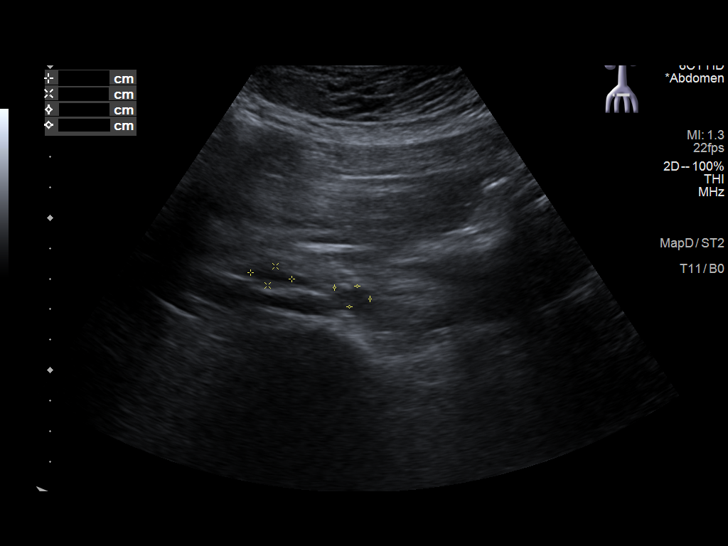

[13 of 25 positions shown; findings below may reference images not displayed]

FINDINGS: Gallbladder: Gallbladder wall thickness remains normal. No
pericholecystic fluid. No gallstones identified, although some
dependent sludge is suspected today.

Common bile duct: Diameter: 3 mm, normal.

Liver: Chronic echogenic liver (image 42). Mildly coarse hepatic
echotexture with no discrete liver lesion. Portal vein is patent on
color Doppler imaging with normal direction of blood flow towards
the liver.

IVC: Incompletely visualized due to overlying bowel gas, visualized
portions within normal limits.

Pancreas: Incompletely visualized due to overlying bowel gas,
visualized portions within normal limits.

Spleen: Spleen size is stable at the upper limits of normal to
mildly enlarged (12.9 cm image 54). No discrete splenic lesion.

Right Kidney: Length: 10.8 cm. Echogenicity within normal limits.
Small chronic benign appearing renal midpole cyst measures 18 mm
(image 65). No solid mass or hydronephrosis visualized.

Left Kidney: Length: 11.1 cm. Echogenicity within normal limits. No
mass or hydronephrosis visualized.

Abdominal aorta: No aneurysm visualized, but the distal aorta mildly
enlarged, 2.7-2.8 cm (1.5 times the proximal normal segment).

Other findings: None.
IMPRESSION: 1. Stable ultrasound appearance of the liver. Chronic increased
echogenicity compatible with steatosis or other chronic
hepatocellular disease. No discrete liver lesion.
2. Stable borderline to mild splenomegaly.
3. Possible gallbladder sludge but no stones identified.
4. Borderline to mild infrarenal abdominal aortic aneurysm (2.8 cm).
Recommend follow-up with Aorta Ultrasound in 5 Years. This
recommendation follows ACR consensus guidelines: White Paper of the
ACR Incidental Findings Committee II on Vascular Findings. [HOSPITAL] 6338; [DATE].

## 2022-07-26 ENCOUNTER — Other Ambulatory Visit: Payer: Self-pay | Admitting: Gastroenterology

## 2022-07-26 DIAGNOSIS — B181 Chronic viral hepatitis B without delta-agent: Secondary | ICD-10-CM

## 2022-07-27 ENCOUNTER — Ambulatory Visit
Admission: RE | Admit: 2022-07-27 | Discharge: 2022-07-27 | Disposition: A | Discharge status: Home / Self Care | Payer: Medicare Other | Source: Ambulatory Visit | Attending: Gastroenterology | Admitting: Gastroenterology

## 2022-07-27 DIAGNOSIS — B181 Chronic viral hepatitis B without delta-agent: Secondary | ICD-10-CM | POA: Diagnosis present

## 2022-10-18 ENCOUNTER — Ambulatory Visit (INDEPENDENT_AMBULATORY_CARE_PROVIDER_SITE_OTHER): Payer: Medicare Other

## 2022-10-18 ENCOUNTER — Encounter (INDEPENDENT_AMBULATORY_CARE_PROVIDER_SITE_OTHER): Payer: Self-pay | Admitting: Vascular Surgery

## 2022-10-18 ENCOUNTER — Ambulatory Visit (INDEPENDENT_AMBULATORY_CARE_PROVIDER_SITE_OTHER): Payer: Medicare Other | Admitting: Vascular Surgery

## 2022-10-18 VITALS — BP 101/66 | HR 66 | Resp 18 | Ht 69.0 in | Wt 170.8 lb

## 2022-10-18 DIAGNOSIS — I7143 Infrarenal abdominal aortic aneurysm, without rupture: Secondary | ICD-10-CM | POA: Diagnosis not present

## 2022-10-18 DIAGNOSIS — I10 Essential (primary) hypertension: Secondary | ICD-10-CM | POA: Diagnosis not present

## 2022-10-18 NOTE — Assessment & Plan Note (Signed)
Duplex today shows a stable 3.1 cm abdominal aortic aneurysm with ectasia to mild aneurysmal degeneration of both common iliac arteries but no growth from a year ago.  Continue blood pressure control and recommend abstinence from tobacco.  Recheck in 1 year.

## 2022-10-18 NOTE — Progress Notes (Signed)
MRN : [id number]  Arthur Schroeder is a 67 y.o. (May 06, 1955) male who presents with chief complaint of  Chief Complaint  Patient presents with   Follow-up    1 year aorta.  .  History of Present Illness: Patient returns today in follow up of his abdominal aortic aneurysm.  His blood pressure has been under good control.  He does continue to smoke and understands this may make the aneurysm grow.  No aneurysm related symptoms. Specifically, the patient denies new back or abdominal pain, or signs of peripheral embolization.  Duplex today shows a stable 3.1 cm abdominal aortic aneurysm with ectasia to mild aneurysmal degeneration of both common iliac arteries but no growth from a year ago.  Current Outpatient Medications  Medication Sig Dispense Refill   carvedilol (COREG) 25 MG tablet Take 25 mg by mouth 2 (two) times daily with a meal.      folic acid (FOLVITE) 1 MG tablet Take 1 mg by mouth daily.      hydrochlorothiazide (HYDRODIURIL) 25 MG tablet Take 25 mg by mouth daily.     lisinopril (PRINIVIL,ZESTRIL) 30 MG tablet Take 30 mg by mouth 2 (two) times daily.      meloxicam (MOBIC) 7.5 MG tablet Take 7.5 mg by mouth daily.     omeprazole (PRILOSEC) 20 MG capsule Take 20 mg by mouth daily.     tamsulosin (FLOMAX) 0.4 MG CAPS capsule Take 1 capsule (0.4 mg total) by mouth daily. 90 capsule 3   tenofovir (VIREAD) 300 MG tablet Take 300 mg by mouth daily.     No current facility-administered medications for this visit.    Past Medical History:  Diagnosis Date   AAA (abdominal aortic aneurysm) (HCC)    Alpha+ thalassemia (HCC)    Barrett's esophagus    Erosive gastritis    Gastropathy    GERD (gastroesophageal reflux disease)    HBV (hepatitis B virus) infection    Hepatitis    chronic hep.B of low infectivity   Hypertension     Past Surgical History:  Procedure Laterality Date   BACK SURGERY     COLONOSCOPY WITH PROPOFOL N/A 01/31/2017   Procedure: COLONOSCOPY WITH PROPOFOL;   Surgeon: Toledo, Boykin Nearing, MD;  Location: [facility];  Service: Endoscopy;  Laterality: N/A;   COLONOSCOPY WITH PROPOFOL N/A 05/17/2021   Procedure: COLONOSCOPY WITH PROPOFOL;  Surgeon: Regis Bill, MD;  Location: [facility];  Service: Endoscopy;  Laterality: N/A;   ESOPHAGOGASTRODUODENOSCOPY (EGD) WITH PROPOFOL N/A 01/31/2017   Procedure: ESOPHAGOGASTRODUODENOSCOPY (EGD) WITH PROPOFOL;  Surgeon: Toledo, Boykin Nearing, MD;  Location: [facility];  Service: Endoscopy;  Laterality: N/A;   ESOPHAGOGASTRODUODENOSCOPY (EGD) WITH PROPOFOL N/A 05/17/2021   Procedure: ESOPHAGOGASTRODUODENOSCOPY (EGD) WITH PROPOFOL;  Surgeon: Regis Bill, MD;  Location: [facility];  Service: Endoscopy;  Laterality: N/A;     Social History   Tobacco Use   Smoking status: Some Days    Passive exposure: Past   Smokeless tobacco: Never  Vaping Use   Vaping status: Never Used  Substance Use Topics   Alcohol use: No   Drug use: No      History reviewed. No pertinent family history.  No Known Allergies   REVIEW OF SYSTEMS (Negative unless checked)  Constitutional: [] Weight loss  [] Fever  [] Chills Cardiac: [] Chest pain   [] Chest pressure   [] Palpitations   [] Shortness of breath when laying flat   [] Shortness of breath at rest   [] Shortness of breath with exertion. Vascular:  []   Pain in legs with walking   [] Pain in legs at rest   [] Pain in legs when laying flat   [] Claudication   [] Pain in feet when walking  [] Pain in feet at rest  [] Pain in feet when laying flat   [] History of DVT   [] Phlebitis   [] Swelling in legs   [] Varicose veins   [] Non-healing ulcers Pulmonary:   [] Uses home oxygen   [] Productive cough   [] Hemoptysis   [] Wheeze  [] COPD   [] Asthma Neurologic:  [] Dizziness  [] Blackouts   [] Seizures   [] History of stroke   [] History of TIA  [] Aphasia   [] Temporary blindness   [] Dysphagia   [] Weakness or numbness in arms   [] Weakness or numbness in legs Musculoskeletal:  [x] Arthritis    [] Joint swelling   [] Joint pain   [] Low back pain Hematologic:  [] Easy bruising  [] Easy bleeding   [] Hypercoagulable state   [x] Anemic   Gastrointestinal:  [] Blood in stool   [] Vomiting blood  [x] Gastroesophageal reflux/heartburn   [x] Abdominal pain Genitourinary:  [] Chronic kidney disease   [] Difficult urination  [] Frequent urination  [] Burning with urination   [] Hematuria Skin:  [] Rashes   [] Ulcers   [] Wounds Psychological:  [] History of anxiety   []  History of major depression.  Physical Examination  BP 101/66 (BP Location: Left Arm)   Pulse 66   Resp 18   Ht 5\' 9"  (1.753 m)   Wt 170 lb 12.8 oz (77.5 kg)   BMI 25.22 kg/m  Gen:  WD/WN, NAD Head: [place]/AT, No temporalis wasting. Ear/Nose/Throat: Hearing grossly intact, nares w/o erythema or drainage Eyes: Conjunctiva clear. Sclera non-icteric Neck: Supple.  Trachea midline Pulmonary:  Good air movement, no use of accessory muscles.  Cardiac: RRR, no JVD Vascular:  Vessel Right Left  Radial Palpable Palpable                                   Gastrointestinal: soft, non-tender/non-distended. No guarding/reflex. No increased aortic impulse Musculoskeletal: M/S 5/5 throughout.  No deformity or atrophy. No edema. Neurologic: Sensation grossly intact in extremities.  Symmetrical.  Speech is fluent.  Psychiatric: Judgment intact, Mood & affect appropriate for pt's clinical situation. Dermatologic: No rashes or ulcers noted.  No cellulitis or open wounds.      Labs No results found for this or any previous visit (from the past 2160 hour(s)).  Radiology No results found.  Assessment/Plan Hypertension blood pressure control important in reducing the progression of atherosclerotic disease and aneurysmal disease. On appropriate oral medications.  His severe, poorly controlled hypertension previously may have been a major cause of his aneurysmal degeneration.   AAA (abdominal aortic aneurysm) (HCC) Duplex today shows a  stable 3.1 cm abdominal aortic aneurysm with ectasia to mild aneurysmal degeneration of both common iliac arteries but no growth from a year ago.  Continue blood pressure control and recommend abstinence from tobacco.  Recheck in 1 year.    Festus Barren, MD  10/18/2022 9:50 AM    This note was created with Dragon medical transcription system.  Any errors from dictation are purely unintentional

## 2022-12-27 ENCOUNTER — Other Ambulatory Visit: Payer: Self-pay | Admitting: Urology

## 2023-02-13 ENCOUNTER — Other Ambulatory Visit: Payer: Medicare Other

## 2023-02-15 ENCOUNTER — Ambulatory Visit: Payer: Medicare Other | Admitting: Urology

## 2023-02-15 ENCOUNTER — Other Ambulatory Visit: Payer: Medicare Other

## 2023-02-15 DIAGNOSIS — N138 Other obstructive and reflux uropathy: Secondary | ICD-10-CM

## 2023-02-16 ENCOUNTER — Other Ambulatory Visit: Payer: Medicare Other

## 2023-02-16 LAB — PSA TOTAL (REFLEX TO FREE): Prostate Specific Ag, Serum: 6.8 ng/mL — ABNORMAL HIGH (ref 0.0–4.0)

## 2023-02-16 LAB — FPSA% REFLEX
% FREE PSA: 16.2 %
PSA, FREE: 1.1 ng/mL

## 2023-02-21 ENCOUNTER — Other Ambulatory Visit: Payer: Self-pay | Admitting: Gastroenterology

## 2023-02-21 DIAGNOSIS — B181 Chronic viral hepatitis B without delta-agent: Secondary | ICD-10-CM

## 2023-02-22 ENCOUNTER — Ambulatory Visit: Payer: Medicare Other | Admitting: Urology

## 2023-02-23 ENCOUNTER — Ambulatory Visit
Admission: RE | Admit: 2023-02-23 | Discharge: 2023-02-23 | Disposition: A | Discharge status: Home / Self Care | Payer: Medicare Other | Source: Ambulatory Visit | Attending: Gastroenterology | Admitting: Gastroenterology

## 2023-02-23 ENCOUNTER — Ambulatory Visit: Payer: Medicare Other | Admitting: Urology

## 2023-02-23 DIAGNOSIS — B181 Chronic viral hepatitis B without delta-agent: Secondary | ICD-10-CM | POA: Insufficient documentation

## 2023-04-06 ENCOUNTER — Ambulatory Visit: Payer: Medicare Other | Admitting: Urology

## 2023-06-22 ENCOUNTER — Ambulatory Visit: Payer: Medicare Other | Admitting: Urology

## 2023-06-22 VITALS — BP 148/76 | HR 68 | Ht 69.0 in | Wt 165.8 lb

## 2023-06-22 DIAGNOSIS — N138 Other obstructive and reflux uropathy: Secondary | ICD-10-CM

## 2023-06-22 DIAGNOSIS — Z125 Encounter for screening for malignant neoplasm of prostate: Secondary | ICD-10-CM | POA: Diagnosis not present

## 2023-06-22 DIAGNOSIS — N401 Enlarged prostate with lower urinary tract symptoms: Secondary | ICD-10-CM | POA: Diagnosis not present

## 2023-06-22 LAB — BLADDER SCAN AMB NON-IMAGING

## 2023-06-22 MED ORDER — ALFUZOSIN HCL ER 10 MG PO TB24: 10.0000 mg | ORAL_TABLET | Freq: Every day | ORAL | 11 refills | Status: DC

## 2023-06-22 NOTE — Progress Notes (Signed)
   06/22/2023 10:30 AM   Arthur Schroeder Nov 09, 1955 [id number]  Reason for visit: Follow up elevated PSA, BPH/LUTS  HPI: 68 year old male who underwent a prostate biopsy in June 2022 for an elevated PSA of 6.5 that showed a 50 g prostate, PSA density 0.13, and all cores showed benign prostate tissue and chronic inflammation with no evidence of malignancy.  He was started on Flomax in December 2022 for obstructive urinary symptoms with weak stream, and he did have improvement initially with IPSS score of 7 after starting Flomax and normal PVRs.  Follow-up PSA values had ranged from 4.5-5 until most recent PSA from December 2024 of 6.8(16% free).  Overall, this is similar to PSA of 6.5 at time of prior negative biopsy.  A repeat PSA was already ordered for today and is pending.  If PSA is stable likely can continue screening on a yearly basis, could consider PHI score in the future or prostate MRI if rising.  He does report worsening of his urinary symptoms over the last year with frequency, weak stream, nocturia 1-3 times overnight.  PVR today is normal at 44ml.  He was interested in trying something different than the Flomax.  I recommended trying the alfuzosin, and risks and benefits were discussed.  Could consider addition of finasteride or cystoscopy/TRUS in the future if worsening symptoms despite alpha blockers.  Trial of alfuzosin to replace Flomax, risk and benefits discussed Follow-up PSA today, if persistently rising consider PHI score RTC 8 weeks urinalysis, IPSS, PVR   Lawerence Pressman, MD  [facility] [address], [address] [place], [place] [address] [phone]

## 2023-06-22 NOTE — Patient Instructions (Signed)

## 2023-06-23 LAB — FPSA% REFLEX
% FREE PSA: 19.4 %
PSA, FREE: 0.97 ng/mL

## 2023-06-23 LAB — PSA TOTAL (REFLEX TO FREE): Prostate Specific Ag, Serum: 5 ng/mL — ABNORMAL HIGH (ref 0.0–4.0)

## 2023-07-25 ENCOUNTER — Encounter (INDEPENDENT_AMBULATORY_CARE_PROVIDER_SITE_OTHER): Payer: Self-pay

## 2023-08-17 ENCOUNTER — Ambulatory Visit: Admitting: Physician Assistant

## 2023-08-17 VITALS — BP 152/88 | HR 64 | Ht 69.0 in | Wt 165.0 lb

## 2023-08-17 DIAGNOSIS — Z125 Encounter for screening for malignant neoplasm of prostate: Secondary | ICD-10-CM | POA: Diagnosis not present

## 2023-08-17 DIAGNOSIS — N401 Enlarged prostate with lower urinary tract symptoms: Secondary | ICD-10-CM | POA: Diagnosis not present

## 2023-08-17 DIAGNOSIS — R3915 Urgency of urination: Secondary | ICD-10-CM

## 2023-08-17 DIAGNOSIS — R3912 Poor urinary stream: Secondary | ICD-10-CM

## 2023-08-17 LAB — BLADDER SCAN AMB NON-IMAGING

## 2023-08-17 MED ORDER — ALFUZOSIN HCL ER 10 MG PO TB24: 10.0000 mg | ORAL_TABLET | Freq: Every day | ORAL | 3 refills | Status: DC

## 2023-08-17 MED ORDER — SOLIFENACIN SUCCINATE 5 MG PO TABS: 5.0000 mg | ORAL_TABLET | Freq: Every day | ORAL | 3 refills | Status: DC

## 2023-08-17 NOTE — Patient Instructions (Signed)
 I've sent in 90-day prescriptions for your alfuzosin  (prostate medicine) and a new medicine called Vesicare, or solifenacin, which should help with your nighttime urination and urgency to urinate. If it causes you constipation, dry mouth, or dry eye, let me know and we can switch you to something else.  I typically don't see insurance companies covering more than a 90-day supply at a time. You may have luck with one of the following options: -Have your son pick up your medicine and mail it to you in [place] -See if [facility] will let you purchase an extra amount of medicine with the cash price, not running it through your insurance -Call your insurance company to see if they make an exception for longer-term medication dispensation to allow for travel

## 2023-08-17 NOTE — Progress Notes (Signed)
 08/17/2023 9:18 AM   Arthur Schroeder 03/10/1955 [id number]  CC: Chief Complaint  Patient presents with   Urinary Tract Infection   HPI: Arthur Schroeder is a 68 y.o. male with PMH BPH with frequency, weak stream, and nocturia x 1-3 and elevated PSA with benign biopsy in 2022 who presents today for symptom recheck on alfuzosin .   Today he reports some improvement on alfuzosin  with stronger stream overnight.  He has occasional urgency but is primarily bothered by nocturia.  He has some dry mouth at baseline.  Notably, he will be traveling to [place] later this month for an unclear duration, possibly 5 to 6 months.  He requests longer-term prescriptions so that he will have his medication while he is abroad.  IPSS 17/mixed as below. PVR 47mL.   IPSS     Row Name 08/17/23 0800         International Prostate Symptom Score   How often have you had the sensation of not emptying your bladder? Less than half the time     How often have you had to urinate less than every two hours? About half the time     How often have you found you stopped and started again several times when you urinated? Less than half the time     How often have you found it difficult to postpone urination? About half the time     How often have you had a weak urinary stream? Less than half the time     How often have you had to strain to start urination? About half the time     How many times did you typically get up at night to urinate? 2 Times     Total IPSS Score 17       Quality of Life due to urinary symptoms   If you were to spend the rest of your life with your urinary condition just the way it is now how would you feel about that? Mixed         PMH: Past Medical History:  Diagnosis Date   AAA (abdominal aortic aneurysm) (HCC)    Alpha+ thalassemia (HCC)    Barrett's esophagus    Erosive gastritis    Gastropathy    GERD (gastroesophageal reflux disease)    HBV (hepatitis B virus) infection    Hepatitis     chronic hep.B of low infectivity   Hypertension     Surgical History: Past Surgical History:  Procedure Laterality Date   BACK SURGERY     COLONOSCOPY WITH PROPOFOL  N/A 01/31/2017   Procedure: COLONOSCOPY WITH PROPOFOL ;  Surgeon: Toledo, Alphonsus Jeans, MD;  Location: [facility];  Service: Endoscopy;  Laterality: N/A;   COLONOSCOPY WITH PROPOFOL  N/A 05/17/2021   Procedure: COLONOSCOPY WITH PROPOFOL ;  Surgeon: Shane Darling, MD;  Location: [facility];  Service: Endoscopy;  Laterality: N/A;   ESOPHAGOGASTRODUODENOSCOPY (EGD) WITH PROPOFOL  N/A 01/31/2017   Procedure: ESOPHAGOGASTRODUODENOSCOPY (EGD) WITH PROPOFOL ;  Surgeon: Toledo, Alphonsus Jeans, MD;  Location: [facility];  Service: Endoscopy;  Laterality: N/A;   ESOPHAGOGASTRODUODENOSCOPY (EGD) WITH PROPOFOL  N/A 05/17/2021   Procedure: ESOPHAGOGASTRODUODENOSCOPY (EGD) WITH PROPOFOL ;  Surgeon: Shane Darling, MD;  Location: [facility];  Service: Endoscopy;  Laterality: N/A;    Home Medications:  Allergies as of 08/17/2023   No Known Allergies      Medication List        Accurate as of August 17, 2023  9:18 AM. If you have any questions, ask your  nurse or doctor.          alfuzosin  10 MG 24 hr tablet Commonly known as: UROXATRAL  Take 1 tablet (10 mg total) by mouth daily with breakfast.   carvedilol 25 MG tablet Commonly known as: COREG Take 25 mg by mouth 2 (two) times daily with a meal.   folic acid 1 MG tablet Commonly known as: FOLVITE Take 1 mg by mouth daily.   hydrochlorothiazide 25 MG tablet Commonly known as: HYDRODIURIL Take 25 mg by mouth daily.   lisinopril 30 MG tablet Commonly known as: ZESTRIL Take 30 mg by mouth 2 (two) times daily.   meloxicam 7.5 MG tablet Commonly known as: MOBIC Take 7.5 mg by mouth daily.   omeprazole 20 MG capsule Commonly known as: PRILOSEC Take 20 mg by mouth daily.   tenofovir 300 MG tablet Commonly known as: VIREAD Take 300 mg by mouth daily.         Allergies:  No Known Allergies  Family History: No family history on file.  Social History:   reports that he has been smoking. He has been exposed to tobacco smoke. He has never used smokeless tobacco. He reports that he does not drink alcohol and does not use drugs.  Physical Exam: BP (!) 152/88   Pulse 64   Ht 5' 9 (1.753 m)   Wt 165 lb (74.8 kg)   BMI 24.37 kg/m   Constitutional:  Alert and oriented, no acute distress, nontoxic appearing HEENT: [place], AT Cardiovascular: No clubbing, cyanosis, or edema Respiratory: Normal respiratory effort, no increased work of breathing Skin: No rashes, bruises or suspicious lesions Neurologic: Grossly intact, no focal deficits, moving all 4 extremities Psychiatric: Normal mood and affect  Laboratory Data: Results for orders placed or performed in visit on 08/17/23  BLADDER SCAN AMB NON-IMAGING   Collection Time: 08/17/23  8:58 AM  Result Value Ref Range   Scan Result 47ml    Assessment & Plan:   1. Benign prostatic hyperplasia with weak urinary stream (Primary) LUTS improved on alfuzosin , will continue this.  I am sending in 90-day supplies, and counseled him to see if he could purchase an additional supply with cash or see if his insurance will offer an exception for additional dispensation in light of travel plans. - BLADDER SCAN AMB NON-IMAGING - alfuzosin  (UROXATRAL ) 10 MG 24 hr tablet; Take 1 tablet (10 mg total) by mouth daily with breakfast.  Dispense: 90 tablet; Refill: 3  2. Urinary urgency Persistent nocturia and urgency.  Will try low-dose Vesicare . - solifenacin  (VESICARE ) 5 MG tablet; Take 1 tablet (5 mg total) by mouth at bedtime.  Dispense: 90 tablet; Refill: 3   Return for Call us  for a follow up appointment when you return from your trip.  Kathreen Pare, PA-C  [facility] [place] [address], [address] [place], [place] [address] [phone]

## 2023-09-20 IMAGING — US US ABDOMEN COMPLETE
1 series · 13 of 25 positions shown · non-contrast
Comparison: 08/13/2020

CLINICAL DATA: Chronic hepatitis B

EXAM:
ABDOMEN ULTRASOUND COMPLETE

[Series 1: us abdomen complete · 0.22mm/px · 13 of 96 slices shown]
[im 1/96]
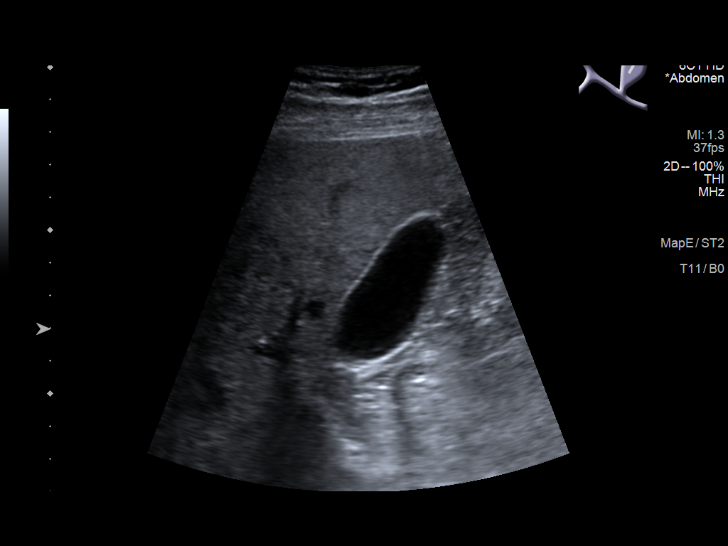
[im 8/96]
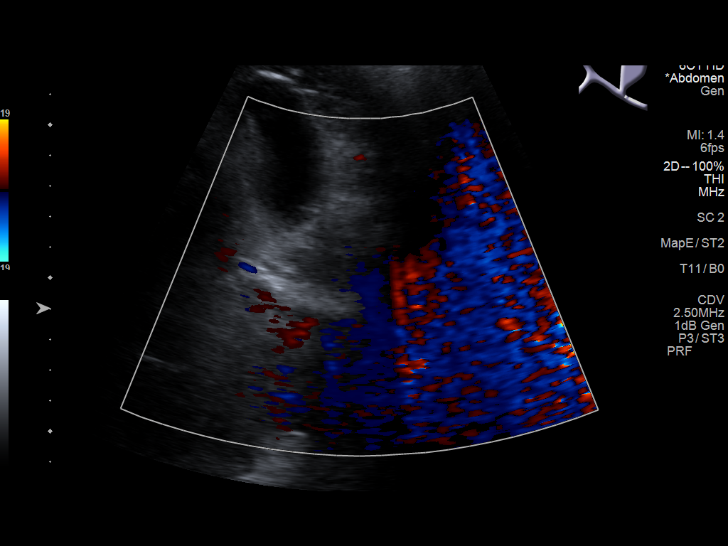
[im 16/96]
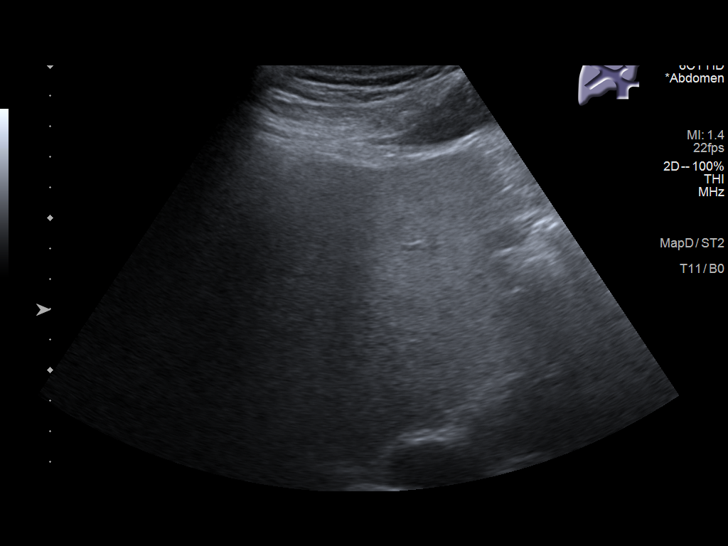
[im 24/96]
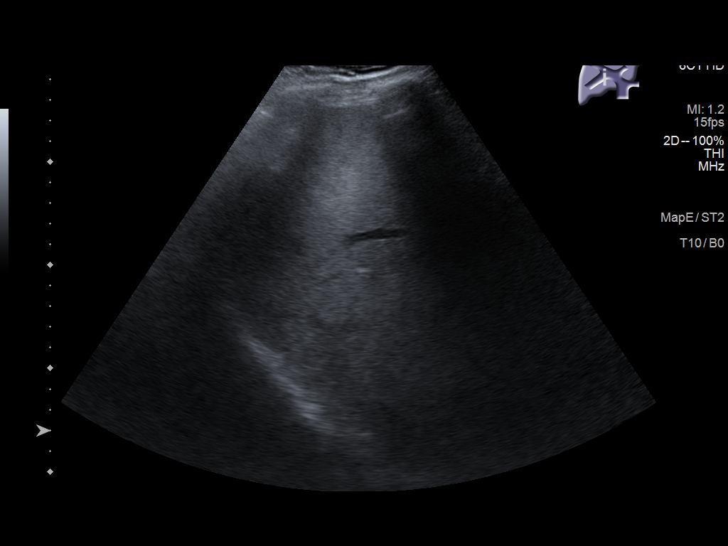
[im 32/96]
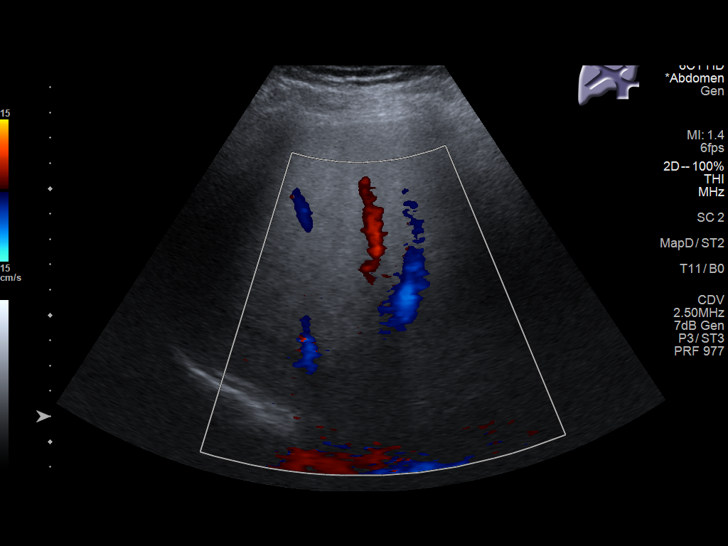
[im 40/96]
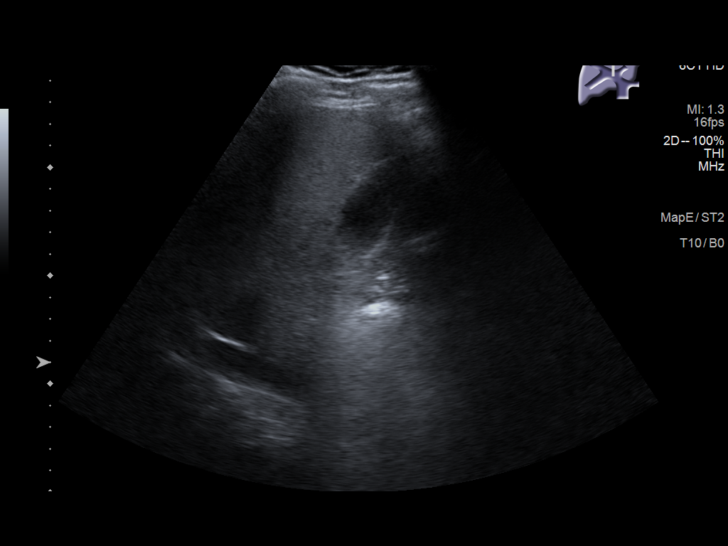
[im 48/96]
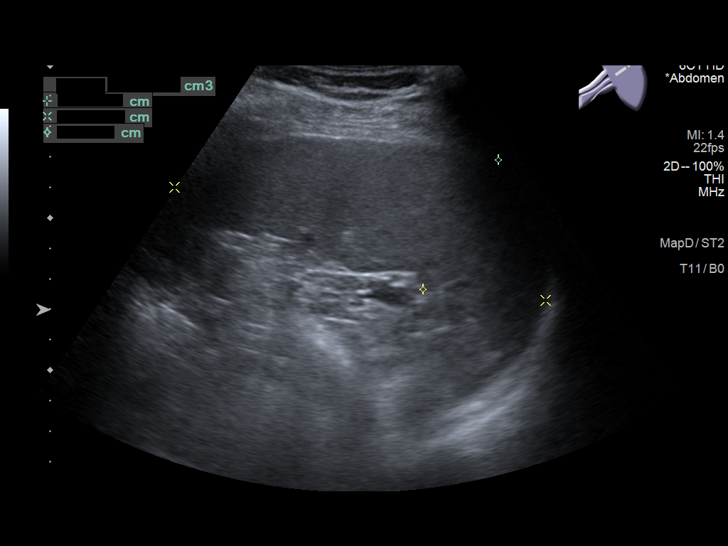
[im 56/96]
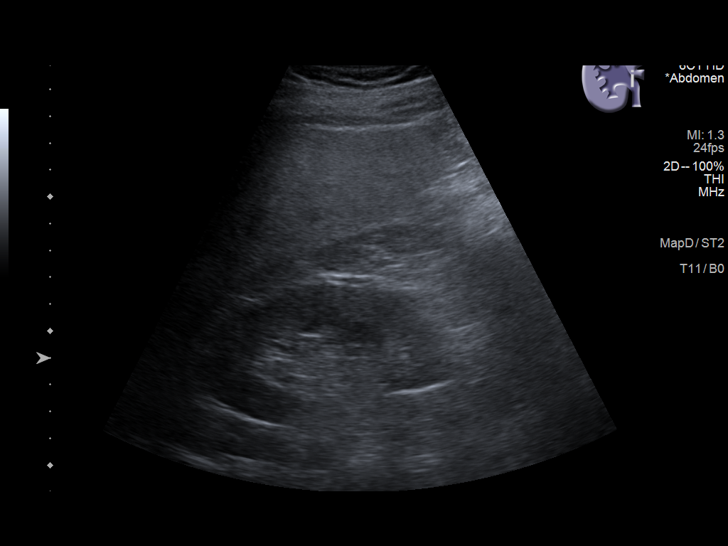
[im 64/96]
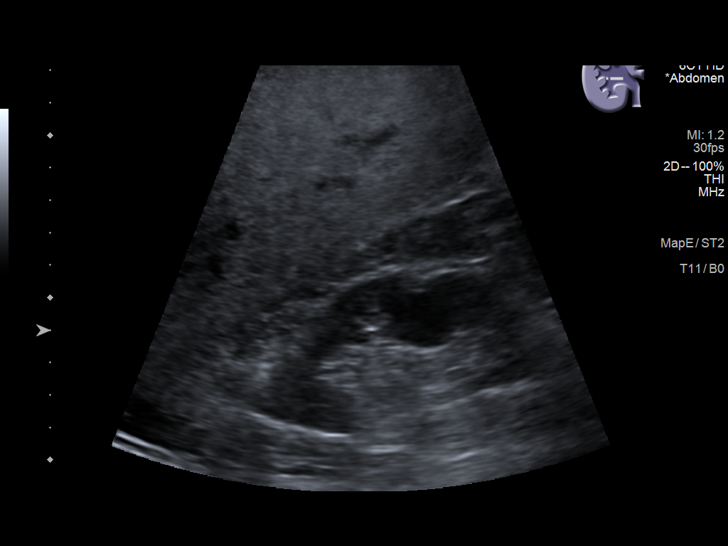
[im 72/96]
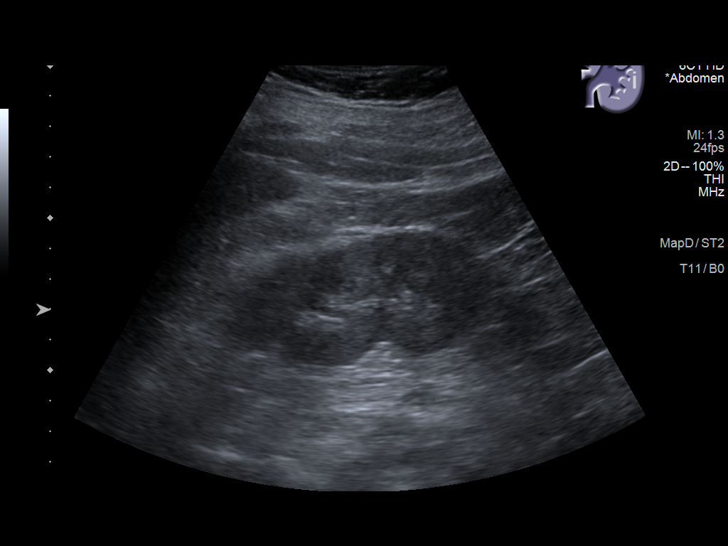
[im 80/96]
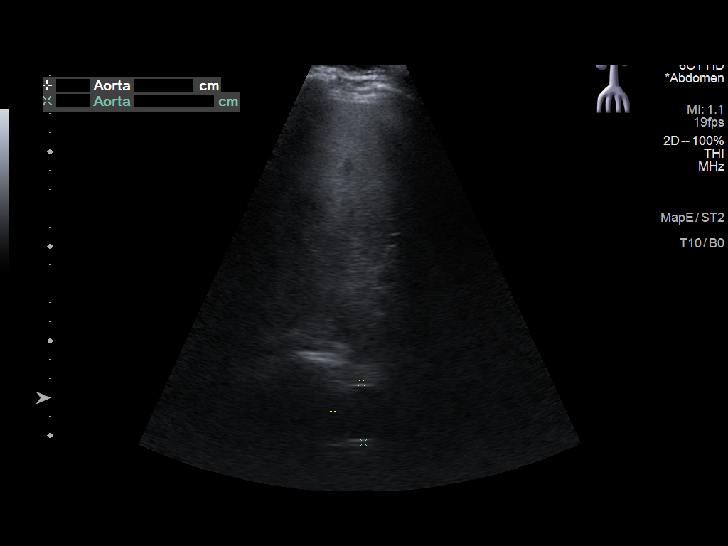
[im 88/96]
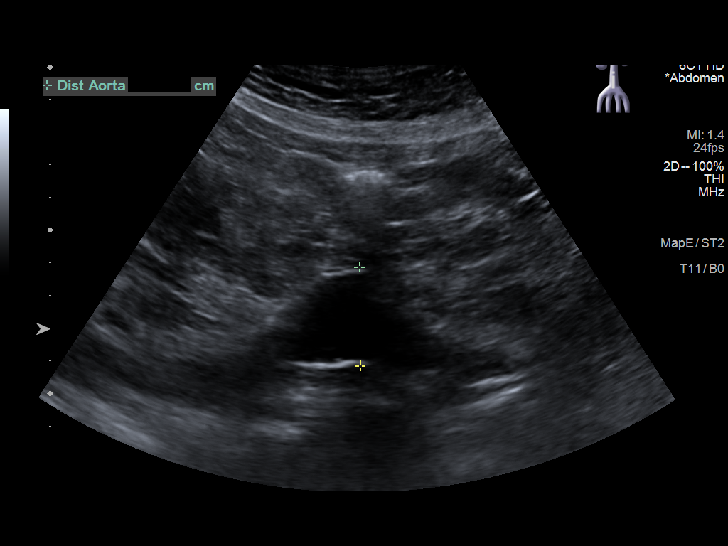
[im 96/96]
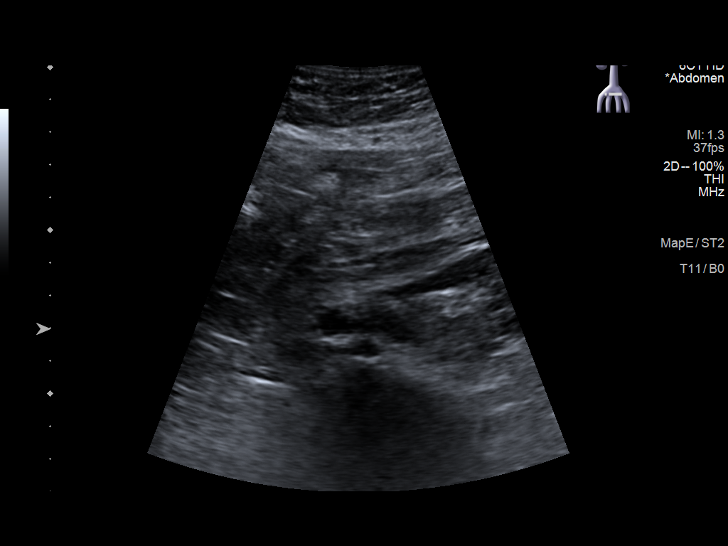

[13 of 25 positions shown; findings below may reference images not displayed]

FINDINGS: Gallbladder: No gallstones or wall thickening visualized. No
sonographic Murphy sign noted by sonographer.

Common bile duct: Diameter: 3 mm

Liver: Diffuse increased liver echotexture unchanged. No focal
parenchymal abnormality or intrahepatic duct dilation. Portal vein
is patent on color Doppler imaging with normal direction of blood
flow towards the liver.

IVC: No abnormality visualized.

Pancreas: Visualized portion unremarkable.

Spleen: 12.5 x 12.7 x 4.9 cm, volume 409 cc. No focal parenchymal
abnormalities.

Right Kidney: Length: 9.2 cm. Normal echogenicity. 1.8 cm simple
appearing cyst. No hydronephrosis.

Left Kidney: Length: 9.8 cm. Echogenicity within normal limits. No
mass or hydronephrosis visualized.

Abdominal aorta: 3 cm infrarenal abdominal aortic aneurysm. Mild
atherosclerosis.

Other findings: None.
IMPRESSION: 1. Stable increased liver echotexture consistent with hepatic
steatosis. No focal abnormality.
2. Splenomegaly.
3. 3 cm infrarenal abdominal aortic aneurysm. Recommend follow-up
every 3 years.
Reference: [HOSPITAL] 0866;[DATE].

## 2023-10-20 ENCOUNTER — Other Ambulatory Visit (INDEPENDENT_AMBULATORY_CARE_PROVIDER_SITE_OTHER): Payer: Self-pay | Admitting: Vascular Surgery

## 2023-10-20 DIAGNOSIS — I7143 Infrarenal abdominal aortic aneurysm, without rupture: Secondary | ICD-10-CM

## 2023-10-23 ENCOUNTER — Other Ambulatory Visit (INDEPENDENT_AMBULATORY_CARE_PROVIDER_SITE_OTHER): Payer: Medicare Other

## 2023-10-23 ENCOUNTER — Ambulatory Visit (INDEPENDENT_AMBULATORY_CARE_PROVIDER_SITE_OTHER): Payer: Medicare Other | Admitting: Nurse Practitioner

## 2024-01-05 ENCOUNTER — Other Ambulatory Visit: Payer: Self-pay | Admitting: Urology

## 2024-01-25 ENCOUNTER — Ambulatory Visit (INDEPENDENT_AMBULATORY_CARE_PROVIDER_SITE_OTHER)

## 2024-01-25 VITALS — BP 100/70 | HR 64 | Ht 69.0 in | Wt 171.0 lb

## 2024-01-25 DIAGNOSIS — I10 Essential (primary) hypertension: Secondary | ICD-10-CM | POA: Diagnosis not present

## 2024-01-25 DIAGNOSIS — K2100 Gastro-esophageal reflux disease with esophagitis, without bleeding: Secondary | ICD-10-CM

## 2024-01-25 DIAGNOSIS — B181 Chronic viral hepatitis B without delta-agent: Secondary | ICD-10-CM | POA: Diagnosis not present

## 2024-01-25 DIAGNOSIS — F17200 Nicotine dependence, unspecified, uncomplicated: Secondary | ICD-10-CM | POA: Diagnosis not present

## 2024-01-25 DIAGNOSIS — Z23 Encounter for immunization: Secondary | ICD-10-CM

## 2024-01-25 DIAGNOSIS — I7143 Infrarenal abdominal aortic aneurysm, without rupture: Secondary | ICD-10-CM

## 2024-01-25 NOTE — Progress Notes (Signed)
 New Patient Visit   Physician: Wilberta Dorvil A Aras Albarran, MD  Patient: Arthur Schroeder   DOB: [birth year]   68 y.o. Male  MRN: [id number] Visit Date: 01/25/2024   Chief Complaint  Patient presents with   Establish Care   Subjective  [name] is a 68 y.o. male who presents today as a new patient to establish care.   HPI  Discussed the use of AI scribe software for clinical note transcription with the patient, who gave verbal consent to proceed.  History of Present Illness   Arthur Schroeder is a 68 year old male who presents to establish care.  Lower urinary tract symptoms and prostate findings - Benign prostatic hyperplasia managed by urologist - Elevated PSA levels, prostate biopsy in June 2022 negative for malignancy - Not currently taking alfuzosin .  Symptoms stable  Abdominal aortic aneurysm - Aneurysm last measured at 3.1 cm in August 2024 - Stable size since last measurement  Gastroesophageal reflux and barrett's esophagus - Barrett's esophagus and gastroesophageal reflux disease  - Takes omeprazole for symptom control - No recent heartburn - No upper endoscopy performed in several years due to stability and does follow with GI for Hepatitis B management  Chronic hepatitis B - Chronic hepatitis B managed with tenofovir for past eight years - Effective viral suppression - Followed by gastroenterologist - no chronic complications to date.  We are awaiting records  Hypertension - Managed with hydrochlorothiazide 25 mg, lisinopril 30 mg, and carvedilol 25 mg BID - Blood pressure today 100/70 mmHg  Tobacco use - Smokes approximately one pack per day  Musculoskeletal status post back surgery - History of back surgery, with hardware placement 14 years ago for fracture  Cardiopulmonary symptoms - No heart problems - No chest pain, palpitations, cough, or shortness of breath  General well-being - Feels generally well - No major concerns     - Healthy diet, walks daily      ASSESSMENT & PLAN  Encounter Diagnoses  Name Primary?   Chronic hepatitis B virus infection (HCC) Yes   Primary hypertension    Gastroesophageal reflux disease with esophagitis without hemorrhage    Tobacco use disorder    Infrarenal abdominal aortic aneurysm (AAA) without rupture     Orders Placed This Encounter  Procedures   CBC with Differential/Platelet   Comprehensive metabolic panel with GFR   Hemoglobin A1c   Lipid panel   Urinalysis, Routine w reflex microscopic    Assessment and Plan    Essential hypertension Blood pressure controlled but low at 100/70 mmHg.  - Reduce hydrochlorothiazide by half for one week, then discontinue if blood pressure stable. - Monitor blood pressure at home, target 130/80 mmHg. - Continue lisinopril and carvedilol. - Follow-up in three weeks to review blood pressure readings.  Chronic hepatitis B infection Long-standing hepatitis B managed on tenofovir for eight years. - Continue tenofovir. - Obtain notes from [facility] NP Wanda Gails for ongoing management.  Abdominal aortic aneurysm, stable Aneurysm 3.1 cm, stable since August 2024. Under vascular specialist surveillance.  He will be due for repeat imaging.   - Ensure vascular specialist continues surveillance with repeat ultrasound as needed.  Barrett's esophagus and gastroesophageal reflux disease Barrett's esophagus and GERD managed with omeprazole. No recent symptoms. No immediate need for repeat endoscopy per gastroenterologist. - Continue omeprazole. - Follow up with gastroenterologist as needed.  General Health Maintenance Due for routine health maintenance including vaccinations and screenings. - Administered flu shot. - Ordered comprehensive blood work: liver  function, blood counts, cholesterol. - Follow-up in three weeks to review lab results and blood pressure management.     Smoking cessation advised Influenza vaccine given today        Objective  BP  100/70   Pulse 64   Ht 5' 9 (1.753 m)   Wt 171 lb (77.6 kg)   SpO2 98%   BMI 25.25 kg/m      Review of Systems  Constitutional:  Negative for chills, fever and weight loss.  Eyes:  Negative for blurred vision. h Respiratory:  Negative for cough and shortness of breath.   Cardiovascular:  Negative for chest pain and palpitations.  Skin:  Negative for rash.  Psychiatric/Behavioral:  Negative for depression. The patient is not nervous/anxious.      Physical Exam Physical Exam Vitals reviewed.  Constitutional:      Appearance: Normal appearance. Well-developed with normal weight.  HENT:     Head: Normocephalic and atraumatic.  Normal mucous membranes, no oral lesions Eyes:     Pupils: Pupils are equal, round, and reactive to light.  Neck:     Thyroid: No thyroid mass or thyromegaly.  Cardiovascular:     Rate and Rhythm: Normal rate and regular rhythm. Normal heart sounds. Normal peripheral pulses Pulmonary:     Normal breath sounds with normal effort Abdominal:   Abdomen is soft, without tenderness or noted hepatosplenomegaly Musculoskeletal:        General: No swelling or edema  Lymphadenopathy:     Cervical: No cervical adenopathy.  Skin:    General: Skin is warm and dry without noticeable rash. Neurological:     General: No focal deficit present.  Psychiatric:        Mood and Affect: Mood, behavior and cognition normal   Past Medical History:  Diagnosis Date   AAA (abdominal aortic aneurysm)    Alpha+ thalassemia    Barrett's esophagus    Erosive gastritis    Gastropathy    GERD (gastroesophageal reflux disease)    HBV (hepatitis B virus) infection    Hepatitis    chronic hep.B of low infectivity   Hypertension    Past Surgical History:  Procedure Laterality Date   BACK SURGERY     COLONOSCOPY WITH PROPOFOL  N/A 01/31/2017   Procedure: COLONOSCOPY WITH PROPOFOL ;  Surgeon: Toledo, Ladell POUR, MD;  Location: [facility]COPY;  Service: Endoscopy;   Laterality: N/A;   COLONOSCOPY WITH PROPOFOL  N/A 05/17/2021   Procedure: COLONOSCOPY WITH PROPOFOL ;  Surgeon: Maryruth Ole DASEN, MD;  Location: [facility];  Service: Endoscopy;  Laterality: N/A;   ESOPHAGOGASTRODUODENOSCOPY (EGD) WITH PROPOFOL  N/A 01/31/2017   Procedure: ESOPHAGOGASTRODUODENOSCOPY (EGD) WITH PROPOFOL ;  Surgeon: Toledo, Ladell POUR, MD;  Location: [facility];  Service: Endoscopy;  Laterality: N/A;   ESOPHAGOGASTRODUODENOSCOPY (EGD) WITH PROPOFOL  N/A 05/17/2021   Procedure: ESOPHAGOGASTRODUODENOSCOPY (EGD) WITH PROPOFOL ;  Surgeon: Maryruth Ole DASEN, MD;  Location: [facility];  Service: Endoscopy;  Laterality: N/A;   Family Status  Relation Name Status   Mother  Alive   Father  Deceased  No partnership data on file   History reviewed. No pertinent family history. Social History   Socioeconomic History   Marital status: Legally Separated    Spouse name: Not on file   Number of children: Not on file   Years of education: Not on file   Highest education level: Not on file  Occupational History   Not on file  Tobacco Use   Smoking status: Some Days    Passive  exposure: Past   Smokeless tobacco: Never  Vaping Use   Vaping status: Never Used  Substance and Sexual Activity   Alcohol use: No   Drug use: No   Sexual activity: Yes    Birth control/protection: None  Other Topics Concern   Not on file  Social History Narrative   Not on file   Social Drivers of Health   Financial Resource Strain: Not on file  Food Insecurity: Not on file  Transportation Needs: Not on file  Physical Activity: Not on file  Stress: Not on file  Social Connections: Not on file   Outpatient Medications Prior to Visit  Medication Sig   carvedilol (COREG) 25 MG tablet Take 25 mg by mouth 2 (two) times daily with a meal.    folic acid (FOLVITE) 1 MG tablet Take 1 mg by mouth daily.    hydrochlorothiazide (HYDRODIURIL) 25 MG tablet Take 25 mg by mouth daily.   lisinopril  (PRINIVIL,ZESTRIL) 30 MG tablet Take 30 mg by mouth 2 (two) times daily.    omeprazole (PRILOSEC) 20 MG capsule Take 20 mg by mouth daily.   tenofovir (VIREAD) 300 MG tablet Take 300 mg by mouth daily.   [DISCONTINUED] alfuzosin  (UROXATRAL ) 10 MG 24 hr tablet Take 1 tablet (10 mg total) by mouth daily with breakfast. (Patient not taking: Reported on 01/25/2024)   [DISCONTINUED] meloxicam (MOBIC) 7.5 MG tablet Take 7.5 mg by mouth daily. (Patient not taking: Reported on 01/25/2024)   [DISCONTINUED] solifenacin  (VESICARE ) 5 MG tablet Take 1 tablet (5 mg total) by mouth at bedtime. (Patient not taking: Reported on 01/25/2024)   No facility-administered medications prior to visit.   No Known Allergies   There is no immunization history on file for this patient.  Health Maintenance  Topic Date Due   Medicare Annual Wellness (AWV)  Never done   Hepatitis C Screening  Never done   DTaP/Tdap/Td (1 - Tdap) Never done   Pneumococcal Vaccine: 50+ Years (1 of 2 - PCV) Never done   Zoster Vaccines- Shingrix (1 of 2) Never done   Influenza Vaccine  Never done   COVID-19 Vaccine (1 - 2025-26 season) Never done   Colonoscopy  05/18/2031   Meningococcal B Vaccine  Aged Out   Hepatitis B Vaccines 19-59 Average Risk  Discontinued    Patient Care Team: Everlene Parris LABOR, MD as PCP - General (Family Medicine)  Depression Screen     No data to display           Parris LABOR Everlene, MD  [facility] [phone] (phone) [phone] (fax)  [facility]

## 2024-01-26 DIAGNOSIS — Z23 Encounter for immunization: Secondary | ICD-10-CM | POA: Diagnosis not present

## 2024-01-26 DIAGNOSIS — B181 Chronic viral hepatitis B without delta-agent: Secondary | ICD-10-CM | POA: Diagnosis not present

## 2024-01-26 NOTE — Addendum Note (Signed)
 Addended by: BLINDA DOUGLAS on: 01/26/2024 04:36 PM   Modules accepted: Orders

## 2024-02-20 ENCOUNTER — Ambulatory Visit (INDEPENDENT_AMBULATORY_CARE_PROVIDER_SITE_OTHER): Admitting: Nurse Practitioner

## 2024-02-20 ENCOUNTER — Other Ambulatory Visit (INDEPENDENT_AMBULATORY_CARE_PROVIDER_SITE_OTHER)

## 2024-02-20 ENCOUNTER — Encounter (INDEPENDENT_AMBULATORY_CARE_PROVIDER_SITE_OTHER): Payer: Self-pay | Admitting: Nurse Practitioner

## 2024-02-20 VITALS — BP 104/70 | HR 59 | Resp 17 | Ht 68.0 in | Wt 170.0 lb

## 2024-02-20 DIAGNOSIS — I7143 Infrarenal abdominal aortic aneurysm, without rupture: Secondary | ICD-10-CM | POA: Diagnosis not present

## 2024-02-20 DIAGNOSIS — I10 Essential (primary) hypertension: Secondary | ICD-10-CM

## 2024-02-20 DIAGNOSIS — F17200 Nicotine dependence, unspecified, uncomplicated: Secondary | ICD-10-CM

## 2024-02-20 NOTE — Progress Notes (Signed)
 Subjective:    Patient ID: Arthur Schroeder, male    DOB: April 14, 1955, 68 y.o.   MRN: [id number] Chief Complaint  Patient presents with   Follow-up    1 year follow up with AAA    HPI  Discussed the use of AI scribe software for clinical note transcription with the patient, who gave verbal consent to proceed.  History of Present Illness Arthur Schroeder is a 68 year old male with an abdominal aortic aneurysm who presents for routine follow-up.  He has an abdominal aortic aneurysm that was last measured in August 2024 at 3.1 centimeters. The most recent measurement of the aneurysm is 3.5 centimeters. No symptoms such as stomach pain are present.No signs and symptoms of distal embolization.  The patient continues to smoke.       Results RADIOLOGY Aortic aneurysm measurement: 3.5 cm (02/20/2024) 10/18/2022:3. cm   Review of Systems  Gastrointestinal:  Negative for abdominal pain.  All other systems reviewed and are negative.      Objective:   Physical Exam Vitals reviewed.  HENT:     Head: Normocephalic.  Cardiovascular:     Rate and Rhythm: Normal rate.     Pulses: Normal pulses.  Pulmonary:     Effort: Pulmonary effort is normal.  Skin:    General: Skin is warm and dry.  Neurological:     Mental Status: He is alert and oriented to person, place, and time.  Psychiatric:        Mood and Affect: Mood normal.        Behavior: Behavior normal.        Thought Content: Thought content normal.        Judgment: Judgment normal.     Physical Exam    BP 104/70   Pulse (!) 59   Resp 17   Ht 5' 8 (1.727 m)   Wt 170 lb (77.1 kg)   BMI 25.85 kg/m   Past Medical History:  Diagnosis Date   AAA (abdominal aortic aneurysm)    Alpha+ thalassemia    Barrett's esophagus    Erosive gastritis    Gastropathy    GERD (gastroesophageal reflux disease)    HBV (hepatitis B virus) infection    Hepatitis    chronic hep.B of low infectivity   Hypertension     Social History    Socioeconomic History   Marital status: Legally Separated    Spouse name: Not on file   Number of children: Not on file   Years of education: Not on file   Highest education level: Not on file  Occupational History   Not on file  Tobacco Use   Smoking status: Some Days    Passive exposure: Past   Smokeless tobacco: Never  Vaping Use   Vaping status: Never Used  Substance and Sexual Activity   Alcohol use: No   Drug use: No   Sexual activity: Yes    Birth control/protection: None  Other Topics Concern   Not on file  Social History Narrative   Not on file   Social Drivers of Health   Tobacco Use: High Risk (02/20/2024)   Patient History    Smoking Tobacco Use: Some Days    Smokeless Tobacco Use: Never    Passive Exposure: Past  Financial Resource Strain: Not on file  Food Insecurity: Not on file  Transportation Needs: Not on file  Physical Activity: Not on file  Stress: Not on file  Social Connections: Not on file  Intimate Partner Violence: Not on file  Depression (EYV7-0): Not on file  Alcohol Screen: Not on file  Housing: Not on file  Utilities: Not on file  Health Literacy: Not on file    Past Surgical History:  Procedure Laterality Date   BACK SURGERY     COLONOSCOPY WITH PROPOFOL  N/A 01/31/2017   Procedure: COLONOSCOPY WITH PROPOFOL ;  Surgeon: Toledo, Ladell POUR, MD;  Location: [facility];  Service: Endoscopy;  Laterality: N/A;   COLONOSCOPY WITH PROPOFOL  N/A 05/17/2021   Procedure: COLONOSCOPY WITH PROPOFOL ;  Surgeon: Maryruth Ole DASEN, MD;  Location: [facility];  Service: Endoscopy;  Laterality: N/A;   ESOPHAGOGASTRODUODENOSCOPY (EGD) WITH PROPOFOL  N/A 01/31/2017   Procedure: ESOPHAGOGASTRODUODENOSCOPY (EGD) WITH PROPOFOL ;  Surgeon: Toledo, Ladell POUR, MD;  Location: [facility];  Service: Endoscopy;  Laterality: N/A;   ESOPHAGOGASTRODUODENOSCOPY (EGD) WITH PROPOFOL  N/A 05/17/2021   Procedure: ESOPHAGOGASTRODUODENOSCOPY (EGD) WITH PROPOFOL ;   Surgeon: Maryruth Ole DASEN, MD;  Location: [facility];  Service: Endoscopy;  Laterality: N/A;    History reviewed. No pertinent family history.  Allergies[1]     Latest Ref Rng & Units 01/31/2017   12:44 PM 03/25/2014   12:17 PM 08/21/2012    2:31 PM  CBC  WBC 3.8 - 10.6 K/uL 7.1  4.4  6.1   Hemoglobin 13.0 - 18.0 g/dL 86.6  88.4  87.7   Hematocrit 40.0 - 52.0 % 43.0  37.5  38.8   Platelets 150 - 440 K/uL 141  107  98       CMP     Component Value Date/Time   PROT 6.8 03/25/2014 1217   ALBUMIN 3.4 03/25/2014 1217   AST 27 03/25/2014 1217   ALT 43 03/25/2014 1217   ALKPHOS 43 (L) 03/25/2014 1217   BILITOT 0.7 03/25/2014 1217     No results found.     Assessment & Plan:   1. Infrarenal abdominal aortic aneurysm (AAA) without rupture (Primary) Infrarenal abdominal aortic aneurysm without rupture Aneurysm increased from 3.1 cm to 3.5 cm over 18 months, below 5 cm intervention threshold. No significant growth noted. - Continue annual monitoring of aneurysm size. - Maintain blood pressure control. - Advised smoking cessation. - VAS US  AAA DUPLEX; Future  2. Tobacco use disorder Smoking cessation was discussed, 3-10 minutes spent on this topic specifically  3. Primary hypertension Continue antihypertensive medications as already ordered, these medications have been reviewed and there are no changes at this time.    Medications Ordered Prior to Encounter[2]  There are no Patient Instructions on file for this visit. Return in about 1 year (around 02/19/2025) for AAA see JD/FB.   Elizabeht Suto E Florina Glas, NP      [1] No Known Allergies [2]  Current Outpatient Medications on File Prior to Visit  Medication Sig Dispense Refill   carvedilol (COREG) 25 MG tablet Take 25 mg by mouth 2 (two) times daily with a meal.      folic acid (FOLVITE) 1 MG tablet Take 1 mg by mouth daily.      hydrochlorothiazide (HYDRODIURIL) 25 MG tablet Take 25 mg by mouth daily.      lisinopril (PRINIVIL,ZESTRIL) 30 MG tablet Take 30 mg by mouth 2 (two) times daily.      omeprazole (PRILOSEC) 20 MG capsule Take 20 mg by mouth daily.     tenofovir (VIREAD) 300 MG tablet Take 300 mg by mouth daily.     No current facility-administered medications on file prior to visit.

## 2024-02-22 ENCOUNTER — Other Ambulatory Visit

## 2024-02-22 DIAGNOSIS — B181 Chronic viral hepatitis B without delta-agent: Secondary | ICD-10-CM

## 2024-02-22 DIAGNOSIS — I10 Essential (primary) hypertension: Secondary | ICD-10-CM

## 2024-02-23 LAB — COMPREHENSIVE METABOLIC PANEL WITH GFR
AG Ratio: 1.6 (calc) (ref 1.0–2.5)
ALT: 12 U/L (ref 9–46)
AST: 14 U/L (ref 10–35)
Albumin: 4.4 g/dL (ref 3.6–5.1)
Alkaline phosphatase (APISO): 76 U/L (ref 35–144)
BUN/Creatinine Ratio: 17 (calc) (ref 6–22)
BUN: 23 mg/dL (ref 7–25)
CO2: 29 mmol/L (ref 20–32)
Calcium: 9.4 mg/dL (ref 8.6–10.3)
Chloride: 104 mmol/L (ref 98–110)
Creat: 1.35 mg/dL — ABNORMAL HIGH (ref 0.70–1.28)
Globulin: 2.7 g/dL (ref 1.9–3.7)
Glucose, Bld: 103 mg/dL — ABNORMAL HIGH (ref 65–99)
Potassium: 3.9 mmol/L (ref 3.5–5.3)
Sodium: 140 mmol/L (ref 135–146)
Total Bilirubin: 0.4 mg/dL (ref 0.2–1.2)
Total Protein: 7.1 g/dL (ref 6.1–8.1)
eGFR: 56 mL/min/1.73m2 — ABNORMAL LOW

## 2024-02-23 LAB — CBC WITH DIFFERENTIAL/PLATELET
Absolute Lymphocytes: 2296 {cells}/uL (ref 850–3900)
Absolute Monocytes: 440 {cells}/uL (ref 200–950)
Basophils Absolute: 72 {cells}/uL (ref 0–200)
Basophils Relative: 0.9 %
Eosinophils Absolute: 504 {cells}/uL — ABNORMAL HIGH (ref 15–500)
Eosinophils Relative: 6.3 %
HCT: 48.2 % (ref 39.4–51.1)
Hemoglobin: 13.6 g/dL (ref 13.2–17.1)
MCH: 17 pg — ABNORMAL LOW (ref 27.0–33.0)
MCHC: 28.2 g/dL — ABNORMAL LOW (ref 31.6–35.4)
MCV: 60.2 fL — ABNORMAL LOW (ref 81.4–101.7)
Monocytes Relative: 5.5 %
Neutro Abs: 4688 {cells}/uL (ref 1500–7800)
Neutrophils Relative %: 58.6 %
Platelets: 255 Thousand/uL (ref 140–400)
RBC: 8.01 Million/uL — ABNORMAL HIGH (ref 4.20–5.80)
RDW: 20 % — ABNORMAL HIGH (ref 11.0–15.0)
Total Lymphocyte: 28.7 %
WBC: 8 Thousand/uL (ref 3.8–10.8)

## 2024-02-23 LAB — URINALYSIS, ROUTINE W REFLEX MICROSCOPIC
Bacteria, UA: NONE SEEN /HPF
Bilirubin Urine: NEGATIVE
Glucose, UA: NEGATIVE
Hgb urine dipstick: NEGATIVE
Ketones, ur: NEGATIVE
Leukocytes,Ua: NEGATIVE
Nitrite: NEGATIVE
RBC / HPF: NONE SEEN /HPF (ref 0–2)
Specific Gravity, Urine: 1.021 (ref 1.001–1.035)
Squamous Epithelial / HPF: NONE SEEN /HPF
WBC, UA: NONE SEEN /HPF (ref 0–5)
pH: 6 (ref 5.0–8.0)

## 2024-02-23 LAB — CBC MORPHOLOGY

## 2024-02-23 LAB — LIPID PANEL
Cholesterol: 154 mg/dL
HDL: 32 mg/dL — ABNORMAL LOW
LDL Cholesterol (Calc): 92 mg/dL
Non-HDL Cholesterol (Calc): 122 mg/dL
Total CHOL/HDL Ratio: 4.8 (calc)
Triglycerides: 206 mg/dL — ABNORMAL HIGH

## 2024-02-23 LAB — HEMOGLOBIN A1C
Hgb A1c MFr Bld: 5.4 %
Mean Plasma Glucose: 108 mg/dL
eAG (mmol/L): 6 mmol/L

## 2024-02-23 LAB — MICROSCOPIC MESSAGE

## 2024-03-04 ENCOUNTER — Ambulatory Visit (INDEPENDENT_AMBULATORY_CARE_PROVIDER_SITE_OTHER)

## 2024-03-04 VITALS — BP 142/90 | HR 61 | Ht 68.0 in | Wt 173.8 lb

## 2024-03-04 DIAGNOSIS — B181 Chronic viral hepatitis B without delta-agent: Secondary | ICD-10-CM

## 2024-03-04 DIAGNOSIS — I10 Essential (primary) hypertension: Secondary | ICD-10-CM | POA: Diagnosis not present

## 2024-03-04 DIAGNOSIS — D751 Secondary polycythemia: Secondary | ICD-10-CM | POA: Diagnosis not present

## 2024-03-04 MED ORDER — HYDROCHLOROTHIAZIDE 12.5 MG PO TABS: 12.5000 mg | ORAL_TABLET | Freq: Every day | ORAL | 3 refills | Status: AC

## 2024-03-04 NOTE — Progress Notes (Signed)
 "           Progress Note  Physician: Spiro Ausborn A Khaza Blansett, MD   HPI: Arthur Schroeder is a 68 y.o. male presenting on 03/04/2024 for Follow-up .  Discussed the use of AI scribe software for clinical note transcription with the patient, who gave verbal consent to proceed.  History of Present Illness   Arthur Schroeder is a 68 year old male with hypertension and hepatitis who presents for blood pressure management and lab review.  Hypertension - Blood pressure readings at home generally around 130/84 mmHg - Occasional elevated readings up to 146/90 mmHg, particularly after physical activity - Previously discontinued hydrochlorothiazide  25 mg due to low blood pressure - Continues on lisinopril 30 mg and carvedilol 25 BID for blood pressure control  Chronic hepatitis B - Currently taking tenofovir - Follow-up appointment with liver specialist scheduled in April  Renal function abnormality - Recent laboratory results show increased creatinine levels, GFR slight decrease.    - Baseline renal function unknown due to lack of records  Erythrocytosis - Consistently elevated red blood cell count over time - No diagnosis of thalassemia trait, this is a chronic finding on distant labs   Medical history:  Relevant past medical, surgical, family and social history reviewed and updated as indicated. Interim medical history since our last visit reviewed.  Allergies and medications reviewed and updated.   ROS: Negative unless specifically indicated above in HPI.   Current Medications[1]       Objective:     BP (!) 142/90 (BP Location: Left Arm, Patient Position: Sitting, Cuff Size: Normal)   Pulse 61   Ht 5' 8 (1.727 m)   Wt 173 lb 12.8 oz (78.8 kg)   SpO2 97%   BMI 26.43 kg/m   Wt Readings from Last 3 Encounters:  03/04/24 173 lb 12.8 oz (78.8 kg)  02/20/24 170 lb (77.1 kg)  01/25/24 171 lb (77.6 kg)    Physical Exam  Physical Exam Vitals reviewed.  Constitutional:       Appearance: Normal appearance. Well-developed with normal weight.  Cardiovascular:     Rate and Rhythm: Normal rate and regular rhythm. Normal heart sounds. Normal peripheral pulses Pulmonary:     Normal breath sounds with normal effort Skin:    General: Skin is warm and dry without noticeable rash. Neurological:     General: No focal deficit present.  Psychiatric:        Mood and Affect: Mood, behavior and cognition normal      Assessment & Plan:   Encounter Diagnoses  Name Primary?   Primary hypertension Yes   Chronic hepatitis B virus infection (HCC)     No orders of the defined types were placed in this encounter.    Assessment and Plan    Primary hypertension Blood pressure mostly 130/80's range at home  - Prescribed hydrochlorothiazide  12.5 mg daily. - Instructed to cut current 25 mg tablets in half for 12.5 mg dose. - Advised to monitor and record blood pressure at home.  Chronic hepatitis B virus infection Liver enzymes stable on tenofovir. Gastroenterology follow-up scheduled. - Continue tenofovir therapy. - Follow up with gastroenterologist on April 7th, 2026.  Chronic kidney disease Slight decline in kidney function with elevated creatinine and decreased GFR. No previous labs for comparison. - Ordered repeat kidney function tests in April or May 2026. - Advised to check kidney function in [place] if unable to return to clinic.               [  1]  Current Outpatient Medications:    carvedilol (COREG) 25 MG tablet, Take 25 mg by mouth 2 (two) times daily with a meal. , Disp: , Rfl:    folic acid (FOLVITE) 1 MG tablet, Take 1 mg by mouth daily. , Disp: , Rfl:    hydrochlorothiazide  (HYDRODIURIL ) 12.5 MG tablet, Take 1 tablet (12.5 mg total) by mouth daily., Disp: 90 tablet, Rfl: 3   lisinopril (PRINIVIL,ZESTRIL) 30 MG tablet, Take 30 mg by mouth 2 (two) times daily. , Disp: , Rfl:    omeprazole (PRILOSEC) 20 MG capsule, Take 20 mg by mouth daily., Disp:  , Rfl:    tenofovir (VIREAD) 300 MG tablet, Take 300 mg by mouth daily., Disp: , Rfl:   "

## 2024-03-05 ENCOUNTER — Other Ambulatory Visit: Payer: Self-pay

## 2024-03-05 NOTE — Telephone Encounter (Signed)
 Prescription Request  03/05/2024  LOV: 03/04/2024  What is the name of the medication or equipment?  lisinopril (PRINIVIL,ZESTRIL) 30 MG tablet 30 mg, 2 times daily    Have you contacted your pharmacy to request a refill? No   Which pharmacy would you like this sent to?  [id number] [place], [place] - [address] AT [facility] OF [address] [place] [address] Phone: [phone] Fax: [phone]    Patient notified that their request is being sent to the clinical staff for review and that they should receive a response within 2 business days.   Please advise at Mobile [phone] (mobile)

## 2024-03-06 ENCOUNTER — Other Ambulatory Visit: Payer: Self-pay

## 2024-03-06 MED ORDER — LISINOPRIL 30 MG PO TABS: 30.0000 mg | ORAL_TABLET | Freq: Every day | ORAL | 1 refills | Status: AC

## 2025-02-19 ENCOUNTER — Other Ambulatory Visit (INDEPENDENT_AMBULATORY_CARE_PROVIDER_SITE_OTHER)

## 2025-02-19 ENCOUNTER — Ambulatory Visit (INDEPENDENT_AMBULATORY_CARE_PROVIDER_SITE_OTHER): Admitting: Nurse Practitioner
